# Patient Record
Sex: Male | Born: 1935 | Race: White | Hispanic: No | Marital: Married | Smoking: Never smoker
Health system: Southern US, Community
[De-identification: ages and names within clinical notes are randomized; demographics above are authoritative.]

## PROBLEM LIST (undated history)

## (undated) DIAGNOSIS — I519 Heart disease, unspecified: Secondary | ICD-10-CM

## (undated) DIAGNOSIS — R0602 Shortness of breath: Secondary | ICD-10-CM

## (undated) DIAGNOSIS — I10 Essential (primary) hypertension: Secondary | ICD-10-CM

## (undated) DIAGNOSIS — E7800 Pure hypercholesterolemia, unspecified: Secondary | ICD-10-CM

## (undated) DIAGNOSIS — K219 Gastro-esophageal reflux disease without esophagitis: Secondary | ICD-10-CM

## (undated) DIAGNOSIS — I2510 Atherosclerotic heart disease of native coronary artery without angina pectoris: Secondary | ICD-10-CM

## (undated) DIAGNOSIS — I219 Acute myocardial infarction, unspecified: Secondary | ICD-10-CM

## (undated) HISTORY — DX: Heart disease, unspecified: I51.9

## (undated) HISTORY — PX: CATARACT EXTRACTION: SUR2

## (undated) HISTORY — DX: Pure hypercholesterolemia, unspecified: E78.00

## (undated) HISTORY — DX: Shortness of breath: R06.02

## (undated) HISTORY — PX: APPENDECTOMY: SHX54

## (undated) HISTORY — DX: Essential (primary) hypertension: I10

## (undated) HISTORY — DX: Atherosclerotic heart disease of native coronary artery without angina pectoris: I25.10

## (undated) HISTORY — DX: Gastro-esophageal reflux disease without esophagitis: K21.9

## (undated) HISTORY — DX: Acute myocardial infarction, unspecified: I21.9

---

## 2004-09-30 ENCOUNTER — Emergency Department (HOSPITAL_COMMUNITY): Admission: EM | Admit: 2004-09-30 | Discharge: 2004-09-30 | Payer: Self-pay | Admitting: Family Medicine

## 2007-10-10 DIAGNOSIS — I219 Acute myocardial infarction, unspecified: Secondary | ICD-10-CM

## 2007-10-10 HISTORY — DX: Acute myocardial infarction, unspecified: I21.9

## 2008-02-13 ENCOUNTER — Ambulatory Visit: Payer: Self-pay | Admitting: Cardiology

## 2008-02-13 ENCOUNTER — Ambulatory Visit: Payer: Self-pay | Admitting: Pulmonary Disease

## 2008-02-13 ENCOUNTER — Inpatient Hospital Stay (HOSPITAL_COMMUNITY): Admission: EM | Admit: 2008-02-13 | Discharge: 2008-02-18 | Payer: Self-pay | Admitting: Emergency Medicine

## 2008-02-14 ENCOUNTER — Encounter: Payer: Self-pay | Admitting: Cardiology

## 2010-05-09 HISTORY — PX: CARDIAC CATHETERIZATION: SHX172

## 2010-05-15 ENCOUNTER — Ambulatory Visit: Payer: Self-pay | Admitting: Cardiology

## 2010-05-15 ENCOUNTER — Inpatient Hospital Stay (HOSPITAL_COMMUNITY): Admission: EM | Admit: 2010-05-15 | Discharge: 2010-05-17 | Payer: Self-pay | Admitting: Emergency Medicine

## 2010-06-09 ENCOUNTER — Ambulatory Visit: Payer: Self-pay | Admitting: Cardiology

## 2010-08-23 ENCOUNTER — Ambulatory Visit: Payer: Self-pay | Admitting: Cardiology

## 2010-12-07 ENCOUNTER — Ambulatory Visit: Payer: Self-pay | Admitting: Cardiology

## 2010-12-23 LAB — CBC
HCT: 41.8 % (ref 39.0–52.0)
HCT: 42.4 % (ref 39.0–52.0)
HCT: 42.5 % (ref 39.0–52.0)
Hemoglobin: 13.8 g/dL (ref 13.0–17.0)
Hemoglobin: 14 g/dL (ref 13.0–17.0)
Hemoglobin: 14.1 g/dL (ref 13.0–17.0)
MCH: 31.1 pg (ref 26.0–34.0)
MCH: 31.4 pg (ref 26.0–34.0)
MCH: 32.1 pg (ref 26.0–34.0)
MCHC: 32.5 g/dL (ref 30.0–36.0)
MCHC: 33.3 g/dL (ref 30.0–36.0)
MCHC: 33.5 g/dL (ref 30.0–36.0)
MCV: 94.4 fL (ref 78.0–100.0)
MCV: 95.7 fL (ref 78.0–100.0)
MCV: 95.9 fL (ref 78.0–100.0)
Platelets: 140 10*3/uL — ABNORMAL LOW (ref 150–400)
Platelets: 142 10*3/uL — ABNORMAL LOW (ref 150–400)
Platelets: 145 10*3/uL — ABNORMAL LOW (ref 150–400)
RBC: 4.36 MIL/uL (ref 4.22–5.81)
RBC: 4.44 MIL/uL (ref 4.22–5.81)
RBC: 4.49 MIL/uL (ref 4.22–5.81)
RDW: 13 % (ref 11.5–15.5)
RDW: 13.2 % (ref 11.5–15.5)
RDW: 13.3 % (ref 11.5–15.5)
WBC: 8.2 10*3/uL (ref 4.0–10.5)
WBC: 8.6 10*3/uL (ref 4.0–10.5)
WBC: 9.6 10*3/uL (ref 4.0–10.5)

## 2010-12-23 LAB — CK TOTAL AND CKMB (NOT AT ARMC)
CK, MB: 5.9 ng/mL — ABNORMAL HIGH (ref 0.3–4.0)
CK, MB: 7 ng/mL (ref 0.3–4.0)
CK, MB: 7.9 ng/mL (ref 0.3–4.0)
CK, MB: 8.5 ng/mL (ref 0.3–4.0)
Relative Index: 3 — ABNORMAL HIGH (ref 0.0–2.5)
Relative Index: 3.7 — ABNORMAL HIGH (ref 0.0–2.5)
Relative Index: 3.7 — ABNORMAL HIGH (ref 0.0–2.5)
Relative Index: 3.9 — ABNORMAL HIGH (ref 0.0–2.5)
Total CK: 189 U/L (ref 7–232)
Total CK: 198 U/L (ref 7–232)
Total CK: 201 U/L (ref 7–232)
Total CK: 227 U/L (ref 7–232)

## 2010-12-23 LAB — LIPID PANEL
Cholesterol: 118 mg/dL (ref 0–200)
HDL: 33 mg/dL — ABNORMAL LOW (ref >39)
LDL Cholesterol: 62 mg/dL (ref 0–99)
Total CHOL/HDL Ratio: 3.6 RATIO
Triglycerides: 113 mg/dL (ref <150)
VLDL: 23 mg/dL (ref 0–40)

## 2010-12-23 LAB — BASIC METABOLIC PANEL
BUN: 11 mg/dL (ref 6–23)
BUN: 22 mg/dL (ref 6–23)
CO2: 24 mEq/L (ref 19–32)
CO2: 26 mEq/L (ref 19–32)
Calcium: 8.5 mg/dL (ref 8.4–10.5)
Calcium: 8.6 mg/dL (ref 8.4–10.5)
Chloride: 105 mEq/L (ref 96–112)
Chloride: 107 mEq/L (ref 96–112)
Creatinine, Ser: 1.11 mg/dL (ref 0.4–1.5)
Creatinine, Ser: 1.19 mg/dL (ref 0.4–1.5)
GFR calc Af Amer: >60 mL/min (ref >60)
GFR calc Af Amer: >60 mL/min (ref >60)
GFR calc non Af Amer: 58 mL/min — ABNORMAL LOW (ref >60)
GFR calc non Af Amer: >60 mL/min (ref >60)
Glucose, Bld: 90 mg/dL (ref 70–99)
Glucose, Bld: 95 mg/dL (ref 70–99)
Potassium: 3.7 mEq/L (ref 3.5–5.1)
Potassium: 4 mEq/L (ref 3.5–5.1)
Sodium: 140 mEq/L (ref 135–145)
Sodium: 142 mEq/L (ref 135–145)

## 2010-12-23 LAB — DIFFERENTIAL
Basophils Absolute: 0 10*3/uL (ref 0.0–0.1)
Basophils Relative: 1 % (ref 0–1)
Eosinophils Absolute: 0.4 10*3/uL (ref 0.0–0.7)
Eosinophils Relative: 5 % (ref 0–5)
Lymphocytes Relative: 23 % (ref 12–46)
Lymphs Abs: 1.9 10*3/uL (ref 0.7–4.0)
Monocytes Absolute: 1 10*3/uL (ref 0.1–1.0)
Monocytes Relative: 13 % — ABNORMAL HIGH (ref 3–12)
Neutro Abs: 4.8 10*3/uL (ref 1.7–7.7)
Neutrophils Relative %: 59 % (ref 43–77)

## 2010-12-23 LAB — TROPONIN I
Troponin I: 0.01 ng/mL (ref 0.00–0.06)
Troponin I: 0.01 ng/mL (ref 0.00–0.06)
Troponin I: 0.01 ng/mL (ref 0.00–0.06)
Troponin I: <0.01 ng/mL (ref 0.00–0.06)

## 2010-12-23 LAB — PROTIME-INR
INR: 0.96 (ref 0.00–1.49)
Prothrombin Time: 13 seconds (ref 11.6–15.2)

## 2010-12-23 LAB — TSH: TSH: 4.284 u[IU]/mL (ref 0.350–4.500)

## 2010-12-23 LAB — BRAIN NATRIURETIC PEPTIDE: Pro B Natriuretic peptide (BNP): 261 pg/mL — ABNORMAL HIGH (ref 0.0–100.0)

## 2011-01-10 ENCOUNTER — Ambulatory Visit: Payer: Self-pay | Admitting: Cardiology

## 2011-01-11 ENCOUNTER — Encounter: Payer: Self-pay | Admitting: Cardiology

## 2011-01-11 DIAGNOSIS — I519 Heart disease, unspecified: Secondary | ICD-10-CM | POA: Insufficient documentation

## 2011-01-11 DIAGNOSIS — I10 Essential (primary) hypertension: Secondary | ICD-10-CM | POA: Insufficient documentation

## 2011-01-11 DIAGNOSIS — I219 Acute myocardial infarction, unspecified: Secondary | ICD-10-CM | POA: Insufficient documentation

## 2011-01-11 DIAGNOSIS — I2510 Atherosclerotic heart disease of native coronary artery without angina pectoris: Secondary | ICD-10-CM | POA: Insufficient documentation

## 2011-01-11 DIAGNOSIS — K219 Gastro-esophageal reflux disease without esophagitis: Secondary | ICD-10-CM | POA: Insufficient documentation

## 2011-01-11 DIAGNOSIS — R0602 Shortness of breath: Secondary | ICD-10-CM | POA: Insufficient documentation

## 2011-01-11 DIAGNOSIS — R079 Chest pain, unspecified: Secondary | ICD-10-CM | POA: Insufficient documentation

## 2011-01-11 DIAGNOSIS — E7800 Pure hypercholesterolemia, unspecified: Secondary | ICD-10-CM | POA: Insufficient documentation

## 2011-01-12 ENCOUNTER — Ambulatory Visit (INDEPENDENT_AMBULATORY_CARE_PROVIDER_SITE_OTHER): Payer: Medicare Other | Admitting: Cardiology

## 2011-01-12 ENCOUNTER — Encounter: Payer: Self-pay | Admitting: Cardiology

## 2011-01-12 VITALS — BP 122/60 | HR 58 | Ht 66.0 in | Wt 157.4 lb

## 2011-01-12 DIAGNOSIS — I519 Heart disease, unspecified: Secondary | ICD-10-CM

## 2011-01-12 DIAGNOSIS — I2510 Atherosclerotic heart disease of native coronary artery without angina pectoris: Secondary | ICD-10-CM

## 2011-01-12 DIAGNOSIS — K219 Gastro-esophageal reflux disease without esophagitis: Secondary | ICD-10-CM

## 2011-01-12 DIAGNOSIS — J302 Other seasonal allergic rhinitis: Secondary | ICD-10-CM | POA: Insufficient documentation

## 2011-01-12 DIAGNOSIS — I10 Essential (primary) hypertension: Secondary | ICD-10-CM

## 2011-01-12 DIAGNOSIS — E7800 Pure hypercholesterolemia, unspecified: Secondary | ICD-10-CM

## 2011-01-12 DIAGNOSIS — J309 Allergic rhinitis, unspecified: Secondary | ICD-10-CM

## 2011-01-12 MED ORDER — PANTOPRAZOLE SODIUM 40 MG PO TBEC: 40.0000 mg | DELAYED_RELEASE_TABLET | Freq: Every day | ORAL | Status: DC

## 2011-01-12 NOTE — Assessment & Plan Note (Signed)
Recommend OTC antihistamines. Avoid decongestants due to HTN.

## 2011-01-12 NOTE — Patient Instructions (Signed)
Continue current medications.  Take Protonix 40 mg daily for epigastric pain.  Avoid salt.  Lab work in January looked very good.  Take an antihistamine for sinus congestion such as Benadryl, Claritin, or Allegra. Avoid decongestants since this will raise your blood pressure.   We will see you for an office in 6 months.

## 2011-01-12 NOTE — Assessment & Plan Note (Signed)
Continue dual antiplatelet therapy. Continue risk factor modification.

## 2011-01-12 NOTE — Progress Notes (Signed)
HPI Leonard Simpson is seen for follow up today of CAD. He has been under a lot of stress recently with his wife being hospitalized with a stroke. He complains of a 1 week history of epigastric pain. This is constant. He has no abdominal pain. It is different than his cardiac pain. It was unrelieved with Tums but he did get good relief last night with his wife's Protonix. He denies any dyspnea, chest pain, edema, or orthopnea. His is not on Lasix now and has lost 6 lbs. No Known Allergies  Current Outpatient Prescriptions on File Prior to Visit  Medication Sig Dispense Refill  . aspirin 325 MG tablet Take 325 mg by mouth daily.        . clopidogrel (PLAVIX) 75 MG tablet Take 75 mg by mouth daily.        Marland Kitchen lisinopril (PRINIVIL,ZESTRIL) 10 MG tablet Take 10 mg by mouth daily.        . metoprolol (TOPROL-XL) 100 MG 24 hr tablet Take 100 mg by mouth daily.        . simvastatin (ZOCOR) 80 MG tablet Take 80 mg by mouth at bedtime.        . furosemide (LASIX) 20 MG tablet Take 20 mg by mouth as needed.        . nitroGLYCERIN (NITROSTAT) 0.4 MG SL tablet Place 0.4 mg under the tongue every 5 (five) minutes as needed.          Past Medical History  Diagnosis Date  . Chest pain     SHARP, KNIFE-LIKE PAIN   . SOB (shortness of breath)   . Coronary artery disease   . MI (myocardial infarction) 2009  . Hypertension   . Hypercholesterolemia   . GERD (gastroesophageal reflux disease)   . Left ventricular dysfunction     Past Surgical History  Procedure Date  . Cardiac catheterization 05/2010    NONOBSTRUCTIVE DISEASE    History reviewed. No pertinent family history.  History   Social History  . Marital Status: Married    Spouse Name: N/A    Number of Children: 2  . Years of Education: N/A   Occupational History  . brick laborer     retired   Social History Main Topics  . Smoking status: Never Smoker   . Smokeless tobacco: Former Neurosurgeon    Quit date: 01/11/1981  . Alcohol Use: No  .  Drug Use: No  . Sexually Active: Not on file   Other Topics Concern  . Not on file   Social History Narrative  . No narrative on file    ROS He complains of sinus congestion and drainage for the last month with a hoarse voice. No cough or hemoptysis. All other systems are reviewed and are negative. PHYSICAL EXAM BP 122/60  Pulse 58  Ht 5\' 6"  (1.676 m)  Wt 157 lb 6.4 oz (71.396 kg)  BMI 25.40 kg/m2 The patient is alert and oriented x 3.    The skin is warm and dry.  Color is normal.  The HEENT exam reveals that the sclera are nonicteric.  The mucous membranes are moist.  Sinus membranes are boggy with clear discharge. The carotids are 2+ without bruits.  There is no thyromegaly.  There is no JVD.  The lungs are clear.  The chest wall is non tender.  The heart exam reveals a regular rate with a normal S1 and S2.  There are no murmurs, gallops, or rubs.  The PMI  is not displaced.   Abdominal exam reveals good bowel sounds.  There is no guarding or rebound.  There is no hepatosplenomegaly or tenderness.  There are no masses.  Exam of the legs reveal no clubbing, cyanosis, or edema.  The legs are without rashes.  The distal pulses are intact.  Cranial nerves II - XII are intact.  Motor and sensory functions are intact.  The gait is normal.  ASSESSMENT AND PLAN

## 2011-01-12 NOTE — Assessment & Plan Note (Signed)
Labs by Dr. Ricki Miller looked excellent in Jan.  Continue simvastatin.

## 2011-01-12 NOTE — Assessment & Plan Note (Signed)
Well controlled on current Rx 

## 2011-01-12 NOTE — Assessment & Plan Note (Signed)
Continue ACE inhibitor. No evidence of volume overload. No on beta blocker due to bradycardia.

## 2011-01-12 NOTE — Assessment & Plan Note (Signed)
Epigastric pain consistent with GERD. Will add Protonix 40 mg daily.

## 2011-01-13 ENCOUNTER — Telehealth: Payer: Self-pay | Admitting: Cardiology

## 2011-01-16 ENCOUNTER — Emergency Department (HOSPITAL_COMMUNITY)
Admission: EM | Admit: 2011-01-16 | Discharge: 2011-01-16 | Disposition: A | Discharge status: Home / Self Care | Payer: Medicare Other | Attending: Emergency Medicine | Admitting: Emergency Medicine

## 2011-01-16 ENCOUNTER — Telehealth: Payer: Self-pay | Admitting: Cardiology

## 2011-01-16 ENCOUNTER — Other Ambulatory Visit: Payer: Self-pay | Admitting: *Deleted

## 2011-01-16 ENCOUNTER — Other Ambulatory Visit (HOSPITAL_COMMUNITY): Payer: Medicare Other | Admitting: Radiology

## 2011-01-16 ENCOUNTER — Emergency Department (HOSPITAL_COMMUNITY): Payer: Medicare Other

## 2011-01-16 DIAGNOSIS — I252 Old myocardial infarction: Secondary | ICD-10-CM | POA: Insufficient documentation

## 2011-01-16 DIAGNOSIS — I10 Essential (primary) hypertension: Secondary | ICD-10-CM

## 2011-01-16 DIAGNOSIS — I2510 Atherosclerotic heart disease of native coronary artery without angina pectoris: Secondary | ICD-10-CM | POA: Insufficient documentation

## 2011-01-16 DIAGNOSIS — Z79899 Other long term (current) drug therapy: Secondary | ICD-10-CM | POA: Insufficient documentation

## 2011-01-16 DIAGNOSIS — H5440 Blindness, one eye, unspecified eye: Secondary | ICD-10-CM

## 2011-01-16 DIAGNOSIS — H5460 Unqualified visual loss, one eye, unspecified: Secondary | ICD-10-CM | POA: Insufficient documentation

## 2011-01-16 DIAGNOSIS — Z7982 Long term (current) use of aspirin: Secondary | ICD-10-CM | POA: Insufficient documentation

## 2011-01-16 LAB — POCT I-STAT, CHEM 8
Chloride: 106 mEq/L (ref 96–112)
Creatinine, Ser: 1.2 mg/dL (ref 0.4–1.5)
Glucose, Bld: 96 mg/dL (ref 70–99)
Hemoglobin: 15.3 g/dL (ref 13.0–17.0)
Potassium: 4.2 mEq/L (ref 3.5–5.1)
Sodium: 142 mEq/L (ref 135–145)

## 2011-01-16 LAB — POCT CARDIAC MARKERS
CKMB, poc: 1.7 ng/mL (ref 1.0–8.0)
Myoglobin, poc: 66.5 ng/mL (ref 12–200)
Troponin i, poc: <0.05 ng/mL (ref 0.00–0.09)

## 2011-01-16 LAB — CBC
HCT: 42.6 % (ref 39.0–52.0)
Hemoglobin: 14.6 g/dL (ref 13.0–17.0)
RBC: 4.49 MIL/uL (ref 4.22–5.81)
WBC: 7.7 10*3/uL (ref 4.0–10.5)

## 2011-01-16 LAB — DIFFERENTIAL
Basophils Absolute: 0.1 10*3/uL (ref 0.0–0.1)
Basophils Relative: 1 % (ref 0–1)
Lymphocytes Relative: 26 % (ref 12–46)
Neutro Abs: 4.4 10*3/uL (ref 1.7–7.7)
Neutrophils Relative %: 57 % (ref 43–77)

## 2011-01-16 MED ORDER — CLOPIDOGREL BISULFATE 75 MG PO TABS: 75.0000 mg | ORAL_TABLET | Freq: Every day | ORAL | Status: DC

## 2011-01-16 MED ORDER — METOPROLOL SUCCINATE ER 100 MG PO TB24: 100.0000 mg | ORAL_TABLET | Freq: Every day | ORAL | Status: DC

## 2011-01-16 MED ORDER — LISINOPRIL 10 MG PO TABS: 10.0000 mg | ORAL_TABLET | Freq: Every day | ORAL | Status: DC

## 2011-01-16 NOTE — Telephone Encounter (Signed)
Son called stating father lost vision in (R) eye on Saturday. Saw eye dr (Dr. Dione Booze); told had plaque and needed further tests. Scheduled doppler and echo per request of Dr. Laruth Bouchard office; unable to get both tests done today so will send him to ER to get MRI-MRA brain;echo.Notified Trish.

## 2011-01-16 NOTE — Telephone Encounter (Signed)
Leonard Simpson (son)- Father lost sight in the right eye. Dr. Janene Harvey spoke with on-call doctor on Friday. Wanted several scans done to see how cholesterol is affecting loss of sight.

## 2011-01-16 NOTE — Telephone Encounter (Signed)
escribe medication per fax request  

## 2011-01-17 ENCOUNTER — Telehealth: Payer: Self-pay | Admitting: Cardiology

## 2011-01-17 ENCOUNTER — Other Ambulatory Visit: Payer: Self-pay | Admitting: Cardiology

## 2011-01-17 DIAGNOSIS — H5440 Blindness, one eye, unspecified eye: Secondary | ICD-10-CM

## 2011-01-17 NOTE — Telephone Encounter (Signed)
Patient's son has some concerns regarding his father's recent blindness in one of his eyes.  It was suggested by his ophthalmologist that he may need a carotid doppler done. Leonard Simpson. Wants to speak w/RN regarding whether he should be seen here so Dr.Jordan can order the tests.

## 2011-01-18 ENCOUNTER — Ambulatory Visit (HOSPITAL_COMMUNITY): Payer: Medicare Other | Attending: Cardiology | Admitting: Radiology

## 2011-01-18 ENCOUNTER — Telehealth: Payer: Self-pay | Admitting: *Deleted

## 2011-01-18 ENCOUNTER — Ambulatory Visit (HOSPITAL_COMMUNITY)
Admission: RE | Admit: 2011-01-18 | Discharge: 2011-01-18 | Disposition: A | Discharge status: Home / Self Care | Payer: Medicare Other | Source: Ambulatory Visit | Attending: Cardiology | Admitting: Cardiology

## 2011-01-18 DIAGNOSIS — H5310 Unspecified subjective visual disturbances: Secondary | ICD-10-CM

## 2011-01-18 DIAGNOSIS — I079 Rheumatic tricuspid valve disease, unspecified: Secondary | ICD-10-CM | POA: Insufficient documentation

## 2011-01-18 DIAGNOSIS — G459 Transient cerebral ischemic attack, unspecified: Secondary | ICD-10-CM

## 2011-01-18 DIAGNOSIS — I379 Nonrheumatic pulmonary valve disorder, unspecified: Secondary | ICD-10-CM | POA: Insufficient documentation

## 2011-01-18 DIAGNOSIS — I6529 Occlusion and stenosis of unspecified carotid artery: Secondary | ICD-10-CM | POA: Insufficient documentation

## 2011-01-18 DIAGNOSIS — H539 Unspecified visual disturbance: Secondary | ICD-10-CM | POA: Insufficient documentation

## 2011-01-18 DIAGNOSIS — I252 Old myocardial infarction: Secondary | ICD-10-CM | POA: Insufficient documentation

## 2011-01-18 DIAGNOSIS — I10 Essential (primary) hypertension: Secondary | ICD-10-CM | POA: Insufficient documentation

## 2011-01-18 DIAGNOSIS — I080 Rheumatic disorders of both mitral and aortic valves: Secondary | ICD-10-CM | POA: Insufficient documentation

## 2011-01-18 NOTE — Telephone Encounter (Signed)
Scheduled Echo for Leonard Simpson for blindness (r) eye;dopplers 4/11;echo today 4/11

## 2011-02-01 ENCOUNTER — Telehealth: Payer: Self-pay | Admitting: Cardiology

## 2011-02-01 NOTE — Telephone Encounter (Signed)
Wanted to know when he was due to see Dr. Swaziland again. Advised he was on recall for Oct but if needed to be seen sooner to let me know.  Said eye Dr. Was following him closely. Will call back if needs an app.

## 2011-02-01 NOTE — Telephone Encounter (Signed)
Son, Leonard Simpson, Razon, wants to know when his father is supposed to come in for an appointment. Pls.call and let him know.

## 2011-02-13 ENCOUNTER — Other Ambulatory Visit: Payer: Self-pay | Admitting: Cardiology

## 2011-02-13 NOTE — Telephone Encounter (Signed)
escribe medication per fax request  

## 2011-02-21 NOTE — H&P (Signed)
Leonard Simpson, Leonard Simpson NO.:  [id number]   MEDICAL RECORD NO.:  [id number]          PATIENT TYPE:  INP   LOCATION:  2908                         FACILITY:  [facility]   PHYSICIAN:  Vernice Jefferson, MD          DATE OF BIRTH:  01-26-1924   DATE OF ADMISSION:  02/13/2008  DATE OF DISCHARGE:                              HISTORY & PHYSICAL   CHIEF COMPLAINT:  Chest pressure.   HISTORY OF PRESENT ILLNESS:  The patient is an 77 year old white male  with only past medical history of gastroesophageal reflux disease who  was feeling acute onset chest pressure earlier on today at home.  EMS  was called and found to be bradycardic with rating of 30s as well as  having substernal chest pressure that started approximately 20-30  minutes prior to the EMS arrival, per their report.  Blood pressures  were noted to be 80s over 60s.  He subsequently transcutaneously paced  for bradycardia and given fentanyl and Versed for pain associated with  bradycardia.  When he arrived in the ED, his heart rate was in the 60s,  but he was altered, he was subsequently intubated in the ED for airway  protection and responded well atropine with increase in his heart rate  and was transferred subsequently to PCI lab for emergent PCI.  His EKG  demonstrated inferolateral ST-elevation changes.   PAST MEDICAL HISTORY:  1. GERD.  2. No prior cardiac history.   SOCIAL HISTORY:  He lives with his wife in [place] area.  No smoking.  No alcohol. No drug use.   FAMILY HISTORY:  Reviewed, noncontributory to the patient current  medical condition.   ALLERGIES:  No known drug allergies.   CURRENT MEDICATIONS:  Over-the-counter Prilosec.   REVIEW OF SYSTEMS:  Negative for review of system except for those  dictated in the above HPI.   PHYSICAL EXAMINATION:  VITAL SIGNS:  Blood pressure 150/73, heart rate  in the 80s.  He is afebrile.  GENERAL:  Well-developed, well-nourished white male in no acute  distress.  HEENT:  Moist mucous membranes.  No conjunctival pallor.  NECK:  Supple.  Full range of motion.  No jugular venous distention.  HEART:  Regular rate and rhythm.  No murmurs, rubs, or gallops.  CHEST:  Clear auscultations bilaterally.  No wheezes, rales, rhonchi.  ABDOMEN:  Soft.  Nontender, nondistended.  Normoactive bowel sounds.  EXTREMITIES:  No peripheral edema.  Pulse are 2+ bilaterally.  NEURO:  He is intubated and sedated.  Therefore, he can not be assessed.   Radiologic studies pending.  His EKG initially demonstrated normal sinus  rhythm with a high-grade second-degree AV block with ventricular rate in  30s with inferolateral ST-elevations with evidence of posterior  involvement.  Second EKG demonstrated atrial fibrillation with a  ventricular rate approximately 80 with the same inferolateral ST  elevations with evidence of posterior involvement.   LABS:  White count of 13.1, hemoglobin 15.2, and platelet 224.  BUN and  creatinine of 16 and 1.5, and glucose of 173.  Cardiac enzymes negative  on initial  set.   IMPRESSION:  1. Inferior ST-segment elevation myocardial infarction.   PLAN:  He is status post percutaneous coronary intervention to proximal  to mid right coronary artery lesion with Taxus drug-eluting stents.  He  did have ventricular fibrillation with defibrillated approximately 20  times while on the table, now since stabilized his rhythm, aspirin,  Plavix, Integrilin drip right now, statin and beta-blocker on acute 12  hour dosing based on blood pressure control.  Again in the morning, an  electrocardiogram to reassess LV function.  Left ventriculogram on  initial postcatheterization appeared to be just mildly decreased with  basal to mid inferior wall motion abnormality.  Then management by the  critical care team, will keep him sedated overnight and follow him  clinically.  The patient did possibly aspirate during the intubation;  therefore, we will  follow up on his chest x-ray and may consider  starting antibiotics based on chest x-ray.      Vernice Jefferson, MD  Electronically Signed     JT/MEDQ  D:  02/14/2008  T:  02/14/2008  Job:  [id number]

## 2011-02-21 NOTE — Cardiovascular Report (Signed)
NAME:  Leonard Simpson, FAROOQUI NO.:  [id number]   MEDICAL RECORD NO.:  [id number]           PATIENT TYPE:   LOCATION:                                 FACILITY:   PHYSICIAN:  Peter M. Swaziland, M.D.  DATE OF BIRTH:  1924/08/27   DATE OF PROCEDURE:  DATE OF DISCHARGE:                            CARDIAC CATHETERIZATION   INDICATIONS FOR PROCEDURE:  An 77 year old white male previously in good  health who presented with acute syncopal episode and chest pain.  ECG  performed by EMS showed an acute inferior myocardial infarction with ST  elevation in leads II, III, aVF.  The patient was in complete heart  block.  He had been given fentanyl and Versed in the field and had a  respiratory arrest in the emergency department.  He was intubated.  He  had his old pacemaker in place and brought emergently to the cardiac  catheterization laboratory.   PROCEDURES:  Left heart catheterization, coronary and left ventricular  angiography, and intracoronary stenting of the right coronary.   NEW MEDICATIONS:  The patient was on IV fentanyl drip at 50 mcg per hour  and IV Versed drip at 2 mg per hour.  He received amiodarone 150 mg  bolus and lidocaine 100 mg bolus.  He received Plavix 600 mg per NG  tube.  He received heparin with subsequent ACT of 257 seconds.  He  received double bolus of Integrilin followed by continuous IV infusion.   CONTRAST:  Omnipaque 200 mL.   HEMODYNAMIC DATA:  Aortic pressure of the end of procedure was 177/103  with a mean of 137 and left ventricle pressure was 178 with EDP of 24  mmHg.   COMPLICATIONS:  Initially after being prepped and draped, we confirmed  that the patient's endotracheal tube was in position under fluoro.  We  also confirm his NG tube was in position in the stomach.  Intra-arterial  access was obtained using standard Seldinger technique.  However, before  any angiogram could be performed, the patient developed again transient  heart block  with heart rates in the 30s.  We placed a temporary  transvenous pacing wire through the right femoral vein into the right  heart with good capture.  The patient developed multiple episodes of  ventricular fibrillation and ventricular flutter requiring emergent  cardioversion.  He had up to 15 episodes of this.  After reperfusion of  his right coronary, his rhythm stabilized and he went into sinus rhythm  with a stable rate and required no further pacing or defibrillation.  His blood pressure also stabilized at this point.   ANGIOGRAPHIC DATA:  The left coronary arises and distributes normally.  The left main coronary appeared normal.  The left anterior descending  artery was moderately calcified.  Had diffuse 50% narrowing in the  proximal vessel extending to the mid vessel past the diagonal branch.  The diagonal branch has 70%-80% disease proximally.   It was a moderate-sized intermediate branch which had a 70%-80% stenosis  at its ostium.   The left circumflex coronary was severely calcified and had 30%  narrowing in  the proximal vessel and in the first marginal branch.   The right coronary was a dominant vessel and was occluded proximally.   At this point, we proceeded with emergent intervention of the right  coronary artery.  Using a 6-French FR-4 guide and a 0.014 Prowater wire,  we were able to cross the lesion well.  We initially predilated the  lesion using a 3.0- x 15-mm apex balloon up to 6 atm.  This yielded  reperfusion.  There was extensive disease in the proximal to mid right  coronary with dissection extending proximally and into the midvessel,  and there was thrombus located in the midvessel.  We subsequently  stented the entire proximal to mid right coronary artery using  overlapping 3.5- x 28-mm TAXUS Liberte stents beginning distally and  moving proximally.  These stents were each deployed at 8 atm and then 12  atm with a stent balloon.  We then postdilated the  entire stented  segment with a 3.75- x 20-mm Quantum Maverick balloon up to 14 atm x4.  This yielded an excellent angiographic result with 0% residual stenosis  and TIMI grade 3 flow.  There was no further evidence of dissection or  thrombus.  There was good filling distally.  As noted, the patient's  heart rhythm and blood pressure stabilized at this point.  His ST  segments appeared to return to baseline, and he was transferred to the  Coronary Intensive Care Unit for further aggressive support.   FINAL INTERPRETATION:  1. Two-vessel obstructive atherosclerotic coronary artery disease with      acute occlusion of the proximal right coronary artery.  2. Mild left ventricular dysfunction.  Left ventricular angiography      performed at the end of the case showed basal-to-mid inferior wall      hypokinesia moderate degree with overall mild left ventricular      dysfunction ejection fraction estimated at 45%-50%.  3. Successful reperfusion with intracoronary stenting of the proximal      to mid right coronary artery with drug-eluting stents.           ______________________________  Peter M. Swaziland, M.D.     PMJ/MEDQ  D:  02/14/2008  T:  02/14/2008  Job:  [id number]

## 2011-02-21 NOTE — Discharge Summary (Signed)
NAME:  Leonard Simpson, SCHMIEDER NO.:  [id number]   MEDICAL RECORD NO.:  [id number]          PATIENT TYPE:  INP   LOCATION:  3733                         FACILITY:  [facility]   PHYSICIAN:  Peter M. Swaziland, M.D.  DATE OF BIRTH:  [birth year]   DATE OF ADMISSION:  02/13/2008  DATE OF DISCHARGE:  02/18/2008                               DISCHARGE SUMMARY   HISTORY OF PRESENT ILLNESS:  Mr. Vreeland is an 77 year old white male  who has a history of acid reflux disease.  He complained of acute onset  of chest pain on the day of admission.  An EMS was called and the  patient was found to be severely hypotensive and bradycardic.  He had  Zoll pads placed.  ECG demonstrated acute ST elevation myocardial  infarction inferiorly with complete heart block.  The patient was given  fentanyl sedation in the field and subsequently had increased altered  mental status.  On arrival to the emergency room department, he was  intubated for airway protection and had some witnessed aspiration with  that.  The patient continued to be hemodynamically unstable.  The  patient has no known prior cardiac history.  He had no known allergies.  For details of his past medical history, social history, family history,  and physical exam, please see admission history and physical.   LABORATORY DATA:  Initial ECG showed marked ST elevation in leads II,  III, and AVF as well as milder degree of ST elevation in leads V4  through V5 with reciprocal ST depression in leads I and V2.  The patient  had underlying atrial fibrillation with markedly slow ventricular  response.   HOSPITAL COURSE:  The patient was taken emergently to the cardiac  catheterization laboratory after he was intubated in the emergency room.  He had an NG tube placed.  He was sedated with IV Versed and fentanyl  and respiratory therapy helped manage the patient's ventilator as he was  transported to the cardiac catheterization laboratory.  There,  the  patient underwent left heart catheterization, coronary left angiography.  The patient was very electrically unstable.  We initially placed  temporary transvenous pacemaker via the right femoral vein.  He was  successfully paced.  He then began to have multiple episodes of  ventricular fibrillation and ventricular flutter requiring multiple  shocks.  Altogether, the patient had 22 shocks during the cath lab  procedure.  Coronary angiography during this time demonstrated diffuse  50% disease in the proximal to mid LAD.  There was 70%-80% disease in  the first diagonal branch.  There was also an intermediate branch, which  had 70%-80% ostial stenosis.  Left circumflex coronary had  nonobstructive disease and the right coronary was occluded proximally.  Left ventricular angiography performed in the end of the study  demonstrated basal to mid inferior hypokinesia with overall mild left  ventricular dysfunction, ejection fraction of 45%-50%.  We proceeded  with emergent intervention of the right coronary artery.  The patient  was treated with IV Integrilin and IV heparin, subsequent ACT of 257  seconds.  The right coronary artery was  reperfused with balloon  angioplasty.  This subsequently demonstrated extensive dissection from  the proximal to the mid right coronary artery with thrombus.  This  entire segment was stented using two 3.5 x 28-mm Taxus Liberte stents.  They were postdilated to 3.75 mm.  An excellent angiographic result was  obtained with 0% residual stenosis and TIMI grade 3 flow.  Once  reperfusion was established, the patient hemodynamically improved  significantly.  He had no further episodes of ventricular tachycardia or  fibrillation.  He did receive a single bolus of IV amiodarone and IV  lidocaine in the lab.  His complete heart block resolved.  The patient  was subsequently transferred to the coronary intensive care unit where  he was continued on ventilator support.   He was loaded with aspirin and  Plavix.  He was started on beta blocker therapy.  He was maintained on  IV Integrilin for 18 hours.  By the following morning the patient was  breathing on his own and was able to be successfully extubated.  He had  no subsequent chest pain during his hospital stay, had minimal cough and  sore throat due to his intubation, but otherwise did not develop any  signs or symptoms of aspiration pneumonia.  His chest x-ray remained  clear.   LABORATORY DATA:  His hemoglobin was 15.2, hematocrit 44.4, white count  was 13,100 and declined to 11,500 prior to discharge, platelet count was  224,000.  His coags were normal.  Sodium was 137, potassium 4.2,  chloride 101, CO2 of 23, glucose 172, BUN 16, and creatinine 1.52.  Subsequent creatinine prior to discharge was 1.3 and his glucose's is  normalized.  Initial CPK was 156 with 5.0 MB, then increased to 3416  with 326 MB and then 3887 with 219 MB.  Troponin started at 0.02 and  increased to 60.64 and then 95.23.  Lipid panel showed a total  cholesterol 224, triglycerides 331, HDL 23, and an LDL of 135.  Hemoglobin A1c was 5.8%.   Mr. Tarnowski continued to make excellent progress.  He had no  recurrent anginal symptoms or signs of heart failure.  He had no  subsequent arrhythmias.  He was placed on aspirin, Plavix, ACE  inhibitor, and beta blocker therapy.  He was placed on high-dose statin  therapy.  He was progressively ambulated and made an excellent progress  cardiac rehab therapy.  He was discharged home in stable condition on  Feb 18, 2008.   DISCHARGE DIAGNOSES:  1. Acute inferior ST elevation myocardial infarction.  2. Repetitive ventricular fibrillation.  3. Complete heart block.  4. Atrial fibrillation, resolved.  5. Combined dyslipidemia.  6. Hypertension.  7. Ventilator-dependent respiratory failure, resolved.   DISCHARGE MEDICATIONS:  1. Aspirin 325 mg per day.  2. Plavix 75 mg per day.  3.  Toprol-XL 100 mg per day.  4. Lisinopril 10 mg per day.  5. Simvastatin 80 mg per day.  6. Nitroglycerin 0.4 mg sublingual p.r.n.  7. Protonix 40 mg per day.   The patient was instructed not to drive or do any lifting for 2 weeks.  He is to increase his activity slowly and was given exercise  recommendations by cardiac rehab.  I have encouraged him to enroll in  outpatient cardiac rehab therapy.  He will follow up with Dr. Swaziland in  2 weeks.   DISCHARGE STATUS:  Improved.           ______________________________  Peter M. Swaziland, M.D.  PMJ/MEDQ  D:  02/18/2008  T:  02/18/2008  Job:  [id number]   cc:   Dr. Suzie Portela, [facility]

## 2011-04-27 NOTE — Telephone Encounter (Signed)
Pt was seen in er

## 2011-06-06 ENCOUNTER — Other Ambulatory Visit: Payer: Self-pay | Admitting: Cardiology

## 2011-06-07 NOTE — Telephone Encounter (Signed)
escribe medication per fax request  

## 2011-07-18 ENCOUNTER — Other Ambulatory Visit: Payer: Self-pay | Admitting: Cardiology

## 2011-08-21 ENCOUNTER — Other Ambulatory Visit: Payer: Self-pay | Admitting: Cardiology

## 2012-01-21 ENCOUNTER — Other Ambulatory Visit: Payer: Self-pay | Admitting: Cardiology

## 2012-01-23 NOTE — Telephone Encounter (Signed)
ENCOUNTER COMPLETED 

## 2012-01-24 ENCOUNTER — Encounter: Payer: Self-pay | Admitting: Cardiology

## 2012-01-24 ENCOUNTER — Ambulatory Visit (INDEPENDENT_AMBULATORY_CARE_PROVIDER_SITE_OTHER): Payer: Medicare Other | Admitting: Cardiology

## 2012-01-24 VITALS — BP 152/84 | HR 54 | Ht 66.0 in | Wt 162.0 lb

## 2012-01-24 DIAGNOSIS — I509 Heart failure, unspecified: Secondary | ICD-10-CM

## 2012-01-24 DIAGNOSIS — I2510 Atherosclerotic heart disease of native coronary artery without angina pectoris: Secondary | ICD-10-CM

## 2012-01-24 DIAGNOSIS — I519 Heart disease, unspecified: Secondary | ICD-10-CM

## 2012-01-24 DIAGNOSIS — I10 Essential (primary) hypertension: Secondary | ICD-10-CM

## 2012-01-24 MED ORDER — LISINOPRIL 20 MG PO TABS: 20.0000 mg | ORAL_TABLET | Freq: Every day | ORAL | Status: DC

## 2012-01-24 MED ORDER — METOPROLOL SUCCINATE ER 50 MG PO TB24: 50.0000 mg | ORAL_TABLET | Freq: Every day | ORAL | Status: AC

## 2012-01-24 NOTE — Progress Notes (Signed)
HPI Leonard Simpson is seen for follow up today of CAD. He reports that he has been doing fairly well. He reduced his dose of metoprolol by half and states that he feels so much better. He previously was having some pain in the left scapular area and this resolved. His breathing is much improved. He does complain that his nose runs a lot. He still has some staggering in his gait and decreased vision related to his stroke last year. He has no significant chest pain and states he has used his Protonix only on a when necessary basis. No Known Allergies  Current Outpatient Prescriptions on File Prior to Visit  Medication Sig Dispense Refill  . aspirin 325 MG tablet Take 325 mg by mouth daily.        . clopidogrel (PLAVIX) 75 MG tablet TAKE 1 TABLET BY MOUTH EVERY DAY  30 tablet  0  . nitroGLYCERIN (NITROSTAT) 0.4 MG SL tablet Place 0.4 mg under the tongue every 5 (five) minutes as needed.        . simvastatin (ZOCOR) 80 MG tablet TAKE 1 TABLET BY MOUTH EVERY DAY  90 tablet  3  . pantoprazole (PROTONIX) 40 MG tablet Take 1 tablet (40 mg total) by mouth daily.  30 tablet  11    Past Medical History  Diagnosis Date  . Chest pain     SHARP, KNIFE-LIKE PAIN   . SOB (shortness of breath)   . Coronary artery disease   . MI (myocardial infarction) 2009  . Hypertension   . Hypercholesterolemia   . GERD (gastroesophageal reflux disease)   . Left ventricular dysfunction     Past Surgical History  Procedure Date  . Cardiac catheterization 05/2010    NONOBSTRUCTIVE DISEASE    History reviewed. No pertinent family history.  History   Social History  . Marital Status: Married    Spouse Name: N/A    Number of Children: 2  . Years of Education: N/A   Occupational History  . brick laborer     retired   Social History Main Topics  . Smoking status: Never Smoker   . Smokeless tobacco: Former Neurosurgeon    Quit date: 01/11/1981  . Alcohol Use: No  . Drug Use: No  . Sexually Active: Not on file    Other Topics Concern  . Not on file   Social History Narrative  . No narrative on file    ROS He complains of sinus congestion and drainage. No cough or hemoptysis. No recurrent TIA or CVA symptoms. All other systems are reviewed and are negative. PHYSICAL EXAM BP 152/84  Pulse 54  Ht 5\' 6"  (1.676 m)  Wt 162 lb (73.483 kg)  BMI 26.15 kg/m2 The patient is alert and oriented x 3.    The skin is warm and dry.  Color is normal.  The HEENT exam reveals that the sclera are nonicteric.  The mucous membranes are moist.  Sinus membranes are boggy with clear discharge. The carotids are 2+ without bruits.  There is no thyromegaly.  There is no JVD.  The lungs are clear.  The chest wall is non tender.  The heart exam reveals a regular rate with a normal S1 and S2.  There is a grade 2/6 systolic murmur at the right upper sternal border.  The PMI is not displaced.   Abdominal exam reveals good bowel sounds.  There is no guarding or rebound.  There is no hepatosplenomegaly or tenderness.  There are  no masses.  Exam of the legs reveal no clubbing, cyanosis, or edema.  The legs are without rashes.  The distal pulses are intact.  Cranial nerves II - XII are intact.  Motor and sensory functions are intact.  The gait is normal.  Laboratory data: ECG today demonstrates sinus bradycardia with a rate of 54 beats per minute. He has voltage criteria for LVH.  ASSESSMENT AND PLAN

## 2012-01-24 NOTE — Assessment & Plan Note (Signed)
We will increase his lisinopril as noted above.

## 2012-01-24 NOTE — Assessment & Plan Note (Signed)
He is having no significant anginal symptoms. In fact he is feeling much better with reduction in his metoprolol dose. This makes sense given that his pulse rate today is slow even on the lower dose. We will continue with his dual antiplatelet therapy and current dose of metoprolol.

## 2012-01-24 NOTE — Assessment & Plan Note (Signed)
He appears to be well compensated on exam. His blood pressure is higher today. We will continue with the lower dose of metoprolol but I have increased his lisinopril to 20 mg per day. We will continue with sodium restriction.

## 2012-01-24 NOTE — Patient Instructions (Signed)
Stay on the lower dose of Toprol XL 50 mg daily  Increase lisinopril to 20 mg daily.  Continue your other therapy.  Lawson Fiscal will see you again in 3 months.

## 2012-02-18 ENCOUNTER — Other Ambulatory Visit: Payer: Self-pay | Admitting: Cardiology

## 2012-03-06 ENCOUNTER — Telehealth: Payer: Self-pay | Admitting: Cardiology

## 2012-03-06 MED ORDER — LISINOPRIL 20 MG PO TABS: 20.0000 mg | ORAL_TABLET | Freq: Every day | ORAL | Status: AC

## 2012-03-06 NOTE — Telephone Encounter (Signed)
Patient's son called stated patient needed prescription for lisinopril 20 mg daily sent to [address] [facility] [address].Lisinopril 20 mg daily sent to [facility].

## 2012-03-06 NOTE — Telephone Encounter (Signed)
Please return call to [employer].    Patient son [phone], regarding lisinopril Med change.

## 2012-03-18 ENCOUNTER — Encounter: Payer: Self-pay | Admitting: Cardiology

## 2012-04-12 ENCOUNTER — Other Ambulatory Visit: Payer: Self-pay | Admitting: Cardiology

## 2012-05-02 ENCOUNTER — Ambulatory Visit: Payer: Medicare Other | Admitting: Cardiology

## 2012-05-10 ENCOUNTER — Other Ambulatory Visit: Payer: Self-pay | Admitting: Cardiology

## 2012-06-06 ENCOUNTER — Ambulatory Visit: Payer: Medicare Other | Admitting: Cardiology

## 2012-07-12 ENCOUNTER — Ambulatory Visit: Payer: Medicare Other | Admitting: Cardiology

## 2012-07-29 ENCOUNTER — Ambulatory Visit (INDEPENDENT_AMBULATORY_CARE_PROVIDER_SITE_OTHER): Payer: Medicare Other | Admitting: Cardiology

## 2012-07-29 ENCOUNTER — Encounter: Payer: Self-pay | Admitting: Cardiology

## 2012-07-29 VITALS — BP 152/74 | HR 64 | Ht 66.0 in | Wt 146.0 lb

## 2012-07-29 DIAGNOSIS — I10 Essential (primary) hypertension: Secondary | ICD-10-CM

## 2012-07-29 DIAGNOSIS — I519 Heart disease, unspecified: Secondary | ICD-10-CM

## 2012-07-29 DIAGNOSIS — E7800 Pure hypercholesterolemia, unspecified: Secondary | ICD-10-CM

## 2012-07-29 DIAGNOSIS — I2510 Atherosclerotic heart disease of native coronary artery without angina pectoris: Secondary | ICD-10-CM

## 2012-07-29 NOTE — Progress Notes (Signed)
HPI Leonard Simpson is seen for follow up today of CAD. He complains that he stays drunk all the time and has difficulty walking a straight line. He has had problems with losing his eyesight. He has undergone previous cataract surgery and then had  laser surgery on his retina. He has chronic shortness of breath with exertion. This has not changed significantly. He denies any chest pain or palpitations. He's had no increase in edema. No Known Allergies  Current Outpatient Prescriptions on File Prior to Visit  Medication Sig Dispense Refill  . aspirin 325 MG tablet Take 325 mg by mouth daily.        . clopidogrel (PLAVIX) 75 MG tablet TAKE 1 TABLET BY MOUTH EVERY DAY  30 tablet  5  . lisinopril (PRINIVIL,ZESTRIL) 20 MG tablet Take 1 tablet (20 mg total) by mouth daily.  30 tablet  11  . metoprolol succinate (TOPROL XL) 50 MG 24 hr tablet Take 1 tablet (50 mg total) by mouth daily. Take with or immediately following a meal.  30 tablet  11  . nitroGLYCERIN (NITROSTAT) 0.4 MG SL tablet Place 0.4 mg under the tongue every 5 (five) minutes as needed.        . simvastatin (ZOCOR) 80 MG tablet TAKE 1 TABLET BY MOUTH EVERY DAY  90 tablet  3  . DISCONTD: pantoprazole (PROTONIX) 40 MG tablet Take 1 tablet (40 mg total) by mouth daily.  30 tablet  11    Past Medical History  Diagnosis Date  . Chest pain     SHARP, KNIFE-LIKE PAIN   . SOB (shortness of breath)   . Coronary artery disease   . MI (myocardial infarction) 2009  . Hypertension   . Hypercholesterolemia   . GERD (gastroesophageal reflux disease)   . Left ventricular dysfunction     Past Surgical History  Procedure Date  . Cardiac catheterization 05/2010    NONOBSTRUCTIVE DISEASE  . Cataract extraction     History reviewed. No pertinent family history.  History   Social History  . Marital Status: Married    Spouse Name: N/A    Number of Children: 2  . Years of Education: N/A   Occupational History  . brick laborer     retired     Social History Main Topics  . Smoking status: Never Smoker   . Smokeless tobacco: Former Neurosurgeon    Quit date: 01/11/1981  . Alcohol Use: No  . Drug Use: No  . Sexually Active: Not on file   Other Topics Concern  . Not on file   Social History Narrative  . No narrative on file    ROS He complains of sinus congestion and drainage. No cough or hemoptysis. No recurrent TIA or CVA symptoms. All other systems are reviewed and are negative.  PHYSICAL EXAM BP 152/74  Pulse 64  Ht 5\' 6"  (1.676 m)  Wt 66.225 kg (146 lb)  BMI 23.56 kg/m2 The patient is alert and oriented x 3.    The skin is warm and dry.  Color is normal.  The HEENT exam reveals that the sclera are nonicteric.  The mucous membranes are moist.   The carotids are 2+ without bruits.  There is no thyromegaly.  There is no JVD.  The lungs are clear.  The chest wall is non tender.  The heart exam reveals a regular rate with a normal S1 and S2.  There is a grade 2/6 systolic murmur at the right upper sternal border.  The PMI is not displaced.   Abdominal exam reveals good bowel sounds.  There is no guarding or rebound.  There is no hepatosplenomegaly or tenderness.  There are no masses.  Exam of the legs reveal no clubbing, cyanosis, or edema.  The legs are without rashes.  The distal pulses are intact.  Cranial nerves II - XII are intact.  Motor and sensory functions are intact.  The gait is normal.  Laboratory data:  Dated 03/18/2012 CBC and thyroid functions were normal. Chemistries were normal. BUN 15 and creatinine 1.18. Urinalysis was normal. Total cholesterol 117, triglycerides 136, LDL 60, HDL 30.  ASSESSMENT AND PLAN  1. Coronary disease. Patient is status post stenting of the right coronary during an acute inferior myocardial infarction in 2009. Subsequent cardiac catheterization in 2011 showed nonobstructive disease. Patient has no significant clinical symptoms of angina. He will continue with dual antiplatelet therapy and  statin therapy.  2. History of left ventricular dysfunction. His most recent echocardiogram in April 2012 showed normal ejection fraction.  3. Mild aortic stenosis.  4. Hypertension, well controlled.  5. Hyperlipidemia, well controlled.  6. Gait instability. I think his visual loss is contributing significantly to this. He is going to followup with ophthalmology.

## 2012-07-29 NOTE — Patient Instructions (Addendum)
Continue your current medications.  We will get a copy of your lab work from Dr. Ricki Miller.  I will see you again in 6 months.

## 2012-09-17 ENCOUNTER — Telehealth: Payer: Self-pay | Admitting: Cardiology

## 2012-09-17 NOTE — Telephone Encounter (Signed)
Pt needs refill of clopidigrel , uses [facility] [address], pt out requested last week

## 2012-09-18 MED ORDER — CLOPIDOGREL BISULFATE 75 MG PO TABS: 75.0000 mg | ORAL_TABLET | Freq: Every day | ORAL | Status: DC

## 2012-11-19 ENCOUNTER — Encounter: Payer: Self-pay | Admitting: Cardiology

## 2013-03-07 ENCOUNTER — Other Ambulatory Visit: Payer: Self-pay

## 2013-03-07 MED ORDER — CLOPIDOGREL BISULFATE 75 MG PO TABS: 75.0000 mg | ORAL_TABLET | Freq: Every day | ORAL | Status: DC

## 2013-03-25 ENCOUNTER — Other Ambulatory Visit: Payer: Self-pay | Admitting: *Deleted

## 2013-03-25 MED ORDER — LISINOPRIL 20 MG PO TABS: 20.0000 mg | ORAL_TABLET | Freq: Every day | ORAL | Status: DC

## 2013-06-19 ENCOUNTER — Other Ambulatory Visit: Payer: Self-pay

## 2013-06-19 MED ORDER — SIMVASTATIN 80 MG PO TABS: ORAL_TABLET | ORAL | Status: DC

## 2013-07-16 ENCOUNTER — Other Ambulatory Visit: Payer: Self-pay | Admitting: Cardiology

## 2013-08-17 ENCOUNTER — Other Ambulatory Visit: Payer: Self-pay | Admitting: Cardiology

## 2013-09-14 ENCOUNTER — Other Ambulatory Visit: Payer: Self-pay | Admitting: Cardiology

## 2013-12-19 ENCOUNTER — Other Ambulatory Visit: Payer: Self-pay | Admitting: Cardiology

## 2013-12-31 ENCOUNTER — Other Ambulatory Visit: Payer: Self-pay | Admitting: *Deleted

## 2013-12-31 ENCOUNTER — Telehealth: Payer: Self-pay | Admitting: *Deleted

## 2013-12-31 ENCOUNTER — Other Ambulatory Visit: Payer: Self-pay | Admitting: Cardiology

## 2013-12-31 MED ORDER — SIMVASTATIN 80 MG PO TABS: ORAL_TABLET | ORAL | Status: AC

## 2013-12-31 NOTE — Telephone Encounter (Signed)
Patient needs appointment. Can you call to schedule please? Thanks, MI

## 2013-12-31 NOTE — Telephone Encounter (Signed)
Follow Up  Made appt for pt on 01/21/2014 @ 9:45// SR

## 2014-01-21 ENCOUNTER — Ambulatory Visit: Payer: Medicare Other | Admitting: Cardiology

## 2014-01-29 ENCOUNTER — Encounter: Payer: Self-pay | Admitting: Cardiology

## 2018-07-04 DIAGNOSIS — I454 Nonspecific intraventricular block: Secondary | ICD-10-CM | POA: Diagnosis not present

## 2018-07-04 DIAGNOSIS — K5900 Constipation, unspecified: Secondary | ICD-10-CM | POA: Diagnosis not present

## 2018-07-04 DIAGNOSIS — I10 Essential (primary) hypertension: Secondary | ICD-10-CM | POA: Diagnosis not present

## 2018-07-04 DIAGNOSIS — K8020 Calculus of gallbladder without cholecystitis without obstruction: Secondary | ICD-10-CM | POA: Diagnosis not present

## 2018-07-04 DIAGNOSIS — K838 Other specified diseases of biliary tract: Secondary | ICD-10-CM | POA: Diagnosis not present

## 2018-07-04 DIAGNOSIS — R1010 Upper abdominal pain, unspecified: Secondary | ICD-10-CM | POA: Diagnosis not present

## 2018-07-04 DIAGNOSIS — Z87891 Personal history of nicotine dependence: Secondary | ICD-10-CM | POA: Diagnosis not present

## 2018-07-04 DIAGNOSIS — R1084 Generalized abdominal pain: Secondary | ICD-10-CM | POA: Diagnosis not present

## 2019-12-03 ENCOUNTER — Inpatient Hospital Stay (HOSPITAL_COMMUNITY)
Admission: EM | Admit: 2019-12-03 | Discharge: 2020-01-08 | DRG: 871 | Disposition: E | Payer: Medicare Other | Attending: Internal Medicine | Admitting: Internal Medicine

## 2019-12-03 ENCOUNTER — Other Ambulatory Visit: Payer: Self-pay

## 2019-12-03 ENCOUNTER — Emergency Department (HOSPITAL_COMMUNITY): Payer: Medicare Other

## 2019-12-03 ENCOUNTER — Encounter (HOSPITAL_COMMUNITY): Payer: Self-pay | Admitting: Emergency Medicine

## 2019-12-03 DIAGNOSIS — R6520 Severe sepsis without septic shock: Secondary | ICD-10-CM | POA: Diagnosis present

## 2019-12-03 DIAGNOSIS — R0682 Tachypnea, not elsewhere classified: Secondary | ICD-10-CM

## 2019-12-03 DIAGNOSIS — S31109A Unspecified open wound of abdominal wall, unspecified quadrant without penetration into peritoneal cavity, initial encounter: Secondary | ICD-10-CM | POA: Diagnosis present

## 2019-12-03 DIAGNOSIS — J810 Acute pulmonary edema: Secondary | ICD-10-CM | POA: Diagnosis not present

## 2019-12-03 DIAGNOSIS — K8020 Calculus of gallbladder without cholecystitis without obstruction: Secondary | ICD-10-CM | POA: Diagnosis not present

## 2019-12-03 DIAGNOSIS — N400 Enlarged prostate without lower urinary tract symptoms: Secondary | ICD-10-CM | POA: Diagnosis present

## 2019-12-03 DIAGNOSIS — E860 Dehydration: Secondary | ICD-10-CM | POA: Diagnosis present

## 2019-12-03 DIAGNOSIS — J189 Pneumonia, unspecified organism: Secondary | ICD-10-CM | POA: Diagnosis present

## 2019-12-03 DIAGNOSIS — A419 Sepsis, unspecified organism: Principal | ICD-10-CM | POA: Diagnosis present

## 2019-12-03 DIAGNOSIS — Z743 Need for continuous supervision: Secondary | ICD-10-CM | POA: Diagnosis not present

## 2019-12-03 DIAGNOSIS — Z7902 Long term (current) use of antithrombotics/antiplatelets: Secondary | ICD-10-CM | POA: Diagnosis not present

## 2019-12-03 DIAGNOSIS — I447 Left bundle-branch block, unspecified: Secondary | ICD-10-CM | POA: Diagnosis present

## 2019-12-03 DIAGNOSIS — K5900 Constipation, unspecified: Secondary | ICD-10-CM | POA: Diagnosis not present

## 2019-12-03 DIAGNOSIS — S0990XA Unspecified injury of head, initial encounter: Secondary | ICD-10-CM | POA: Diagnosis not present

## 2019-12-03 DIAGNOSIS — E7800 Pure hypercholesterolemia, unspecified: Secondary | ICD-10-CM | POA: Diagnosis present

## 2019-12-03 DIAGNOSIS — G253 Myoclonus: Secondary | ICD-10-CM | POA: Diagnosis not present

## 2019-12-03 DIAGNOSIS — W19XXXD Unspecified fall, subsequent encounter: Secondary | ICD-10-CM | POA: Diagnosis not present

## 2019-12-03 DIAGNOSIS — N401 Enlarged prostate with lower urinary tract symptoms: Secondary | ICD-10-CM | POA: Diagnosis not present

## 2019-12-03 DIAGNOSIS — W19XXXA Unspecified fall, initial encounter: Secondary | ICD-10-CM

## 2019-12-03 DIAGNOSIS — Z515 Encounter for palliative care: Secondary | ICD-10-CM

## 2019-12-03 DIAGNOSIS — I350 Nonrheumatic aortic (valve) stenosis: Secondary | ICD-10-CM | POA: Diagnosis not present

## 2019-12-03 DIAGNOSIS — K5641 Fecal impaction: Secondary | ICD-10-CM

## 2019-12-03 DIAGNOSIS — Z7189 Other specified counseling: Secondary | ICD-10-CM

## 2019-12-03 DIAGNOSIS — R778 Other specified abnormalities of plasma proteins: Secondary | ICD-10-CM | POA: Diagnosis present

## 2019-12-03 DIAGNOSIS — R609 Edema, unspecified: Secondary | ICD-10-CM | POA: Diagnosis not present

## 2019-12-03 DIAGNOSIS — Z87891 Personal history of nicotine dependence: Secondary | ICD-10-CM | POA: Diagnosis not present

## 2019-12-03 DIAGNOSIS — K838 Other specified diseases of biliary tract: Secondary | ICD-10-CM | POA: Diagnosis not present

## 2019-12-03 DIAGNOSIS — Y92009 Unspecified place in unspecified non-institutional (private) residence as the place of occurrence of the external cause: Secondary | ICD-10-CM

## 2019-12-03 DIAGNOSIS — E785 Hyperlipidemia, unspecified: Secondary | ICD-10-CM | POA: Diagnosis not present

## 2019-12-03 DIAGNOSIS — S4991XA Unspecified injury of right shoulder and upper arm, initial encounter: Secondary | ICD-10-CM | POA: Diagnosis not present

## 2019-12-03 DIAGNOSIS — Z7901 Long term (current) use of anticoagulants: Secondary | ICD-10-CM

## 2019-12-03 DIAGNOSIS — E872 Acidosis, unspecified: Secondary | ICD-10-CM

## 2019-12-03 DIAGNOSIS — S199XXA Unspecified injury of neck, initial encounter: Secondary | ICD-10-CM | POA: Diagnosis not present

## 2019-12-03 DIAGNOSIS — Z66 Do not resuscitate: Secondary | ICD-10-CM | POA: Diagnosis present

## 2019-12-03 DIAGNOSIS — I2510 Atherosclerotic heart disease of native coronary artery without angina pectoris: Secondary | ICD-10-CM | POA: Diagnosis not present

## 2019-12-03 DIAGNOSIS — T796XXA Traumatic ischemia of muscle, initial encounter: Secondary | ICD-10-CM | POA: Diagnosis not present

## 2019-12-03 DIAGNOSIS — G9341 Metabolic encephalopathy: Secondary | ICD-10-CM | POA: Diagnosis present

## 2019-12-03 DIAGNOSIS — I10 Essential (primary) hypertension: Secondary | ICD-10-CM | POA: Diagnosis not present

## 2019-12-03 DIAGNOSIS — I340 Nonrheumatic mitral (valve) insufficiency: Secondary | ICD-10-CM | POA: Diagnosis not present

## 2019-12-03 DIAGNOSIS — R7989 Other specified abnormal findings of blood chemistry: Secondary | ICD-10-CM

## 2019-12-03 DIAGNOSIS — R52 Pain, unspecified: Secondary | ICD-10-CM | POA: Diagnosis not present

## 2019-12-03 DIAGNOSIS — F0390 Unspecified dementia without behavioral disturbance: Secondary | ICD-10-CM | POA: Diagnosis present

## 2019-12-03 DIAGNOSIS — K219 Gastro-esophageal reflux disease without esophagitis: Secondary | ICD-10-CM | POA: Diagnosis present

## 2019-12-03 DIAGNOSIS — F0391 Unspecified dementia with behavioral disturbance: Secondary | ICD-10-CM | POA: Diagnosis not present

## 2019-12-03 DIAGNOSIS — Z20822 Contact with and (suspected) exposure to covid-19: Secondary | ICD-10-CM | POA: Diagnosis not present

## 2019-12-03 DIAGNOSIS — S3993XA Unspecified injury of pelvis, initial encounter: Secondary | ICD-10-CM | POA: Diagnosis not present

## 2019-12-03 DIAGNOSIS — Z03818 Encounter for observation for suspected exposure to other biological agents ruled out: Secondary | ICD-10-CM | POA: Diagnosis not present

## 2019-12-03 DIAGNOSIS — S0993XA Unspecified injury of face, initial encounter: Secondary | ICD-10-CM | POA: Diagnosis not present

## 2019-12-03 DIAGNOSIS — I252 Old myocardial infarction: Secondary | ICD-10-CM

## 2019-12-03 DIAGNOSIS — M6282 Rhabdomyolysis: Secondary | ICD-10-CM | POA: Diagnosis present

## 2019-12-03 DIAGNOSIS — M25511 Pain in right shoulder: Secondary | ICD-10-CM | POA: Diagnosis not present

## 2019-12-03 DIAGNOSIS — N179 Acute kidney failure, unspecified: Secondary | ICD-10-CM | POA: Diagnosis present

## 2019-12-03 DIAGNOSIS — S299XXA Unspecified injury of thorax, initial encounter: Secondary | ICD-10-CM | POA: Diagnosis not present

## 2019-12-03 DIAGNOSIS — R0902 Hypoxemia: Secondary | ICD-10-CM | POA: Diagnosis not present

## 2019-12-03 DIAGNOSIS — J90 Pleural effusion, not elsewhere classified: Secondary | ICD-10-CM | POA: Diagnosis not present

## 2019-12-03 LAB — CBC
HCT: 42.4 % (ref 39.0–52.0)
Hemoglobin: 13.1 g/dL (ref 13.0–17.0)
MCH: 31.6 pg (ref 26.0–34.0)
MCHC: 30.9 g/dL (ref 30.0–36.0)
MCV: 102.4 fL — ABNORMAL HIGH (ref 80.0–100.0)
Platelets: 108 10*3/uL — ABNORMAL LOW (ref 150–400)
RBC: 4.14 MIL/uL — ABNORMAL LOW (ref 4.22–5.81)
RDW: 15.1 % (ref 11.5–15.5)
WBC: 20.6 10*3/uL — ABNORMAL HIGH (ref 4.0–10.5)
nRBC: 0 % (ref 0.0–0.2)

## 2019-12-03 LAB — COMPREHENSIVE METABOLIC PANEL
ALT: 51 U/L — ABNORMAL HIGH (ref 0–44)
AST: 152 U/L — ABNORMAL HIGH (ref 15–41)
Albumin: 2.8 g/dL — ABNORMAL LOW (ref 3.5–5.0)
Alkaline Phosphatase: 52 U/L (ref 38–126)
Anion gap: 11 (ref 5–15)
BUN: 42 mg/dL — ABNORMAL HIGH (ref 8–23)
CO2: 21 mmol/L — ABNORMAL LOW (ref 22–32)
Calcium: 8.3 mg/dL — ABNORMAL LOW (ref 8.9–10.3)
Chloride: 109 mmol/L (ref 98–111)
Creatinine, Ser: 1.98 mg/dL — ABNORMAL HIGH (ref 0.61–1.24)
GFR calc Af Amer: 33 mL/min — ABNORMAL LOW (ref >60)
GFR calc non Af Amer: 28 mL/min — ABNORMAL LOW (ref >60)
Glucose, Bld: 89 mg/dL (ref 70–99)
Potassium: 5.9 mmol/L — ABNORMAL HIGH (ref 3.5–5.1)
Sodium: 141 mmol/L (ref 135–145)
Total Bilirubin: 1.7 mg/dL — ABNORMAL HIGH (ref 0.3–1.2)
Total Protein: 5.9 g/dL — ABNORMAL LOW (ref 6.5–8.1)

## 2019-12-03 LAB — URINALYSIS, ROUTINE W REFLEX MICROSCOPIC
Bilirubin Urine: NEGATIVE
Glucose, UA: NEGATIVE mg/dL
Ketones, ur: NEGATIVE mg/dL
Leukocytes,Ua: NEGATIVE
Nitrite: NEGATIVE
Protein, ur: 30 mg/dL — AB
Specific Gravity, Urine: 1.018 (ref 1.005–1.030)
pH: 5 (ref 5.0–8.0)

## 2019-12-03 LAB — I-STAT CHEM 8, ED
BUN: 61 mg/dL — ABNORMAL HIGH (ref 8–23)
Calcium, Ion: 1.07 mmol/L — ABNORMAL LOW (ref 1.15–1.40)
Chloride: 110 mmol/L (ref 98–111)
Creatinine, Ser: 2.1 mg/dL — ABNORMAL HIGH (ref 0.61–1.24)
Glucose, Bld: 91 mg/dL (ref 70–99)
HCT: 46 % (ref 39.0–52.0)
Hemoglobin: 15.6 g/dL (ref 13.0–17.0)
Potassium: 5.1 mmol/L (ref 3.5–5.1)
Sodium: 141 mmol/L (ref 135–145)
TCO2: 26 mmol/L (ref 22–32)

## 2019-12-03 LAB — CBG MONITORING, ED: Glucose-Capillary: 83 mg/dL (ref 70–99)

## 2019-12-03 LAB — PROTIME-INR
INR: 1.1 (ref 0.8–1.2)
Prothrombin Time: 14.1 seconds (ref 11.4–15.2)

## 2019-12-03 LAB — LACTIC ACID, PLASMA
Lactic Acid, Venous: 4.5 mmol/L (ref 0.5–1.9)
Lactic Acid, Venous: 1.7 mmol/L (ref 0.5–1.9)

## 2019-12-03 LAB — SAMPLE TO BLOOD BANK

## 2019-12-03 LAB — CK: Total CK: 3222 U/L — ABNORMAL HIGH (ref 49–397)

## 2019-12-03 LAB — CDS SEROLOGY

## 2019-12-03 LAB — ETHANOL: Alcohol, Ethyl (B): <10 mg/dL (ref <10)

## 2019-12-03 MED ORDER — SODIUM CHLORIDE 0.9 % IV BOLUS
1000.0000 mL | Freq: Once | INTRAVENOUS | Status: AC
  Administered 2019-12-03: 21:00:00 1000 mL via INTRAVENOUS

## 2019-12-03 MED ORDER — SODIUM CHLORIDE 0.9 % IV BOLUS
500.0000 mL | Freq: Once | INTRAVENOUS | Status: AC
  Administered 2019-12-04: 02:00:00 500 mL via INTRAVENOUS

## 2019-12-03 MED ORDER — SODIUM CHLORIDE 0.9 % IV SOLN
2.0000 g | Freq: Once | INTRAVENOUS | Status: AC
  Administered 2019-12-04: 01:00:00 2 g via INTRAVENOUS
  Filled 2019-12-03: qty 2

## 2019-12-03 MED ORDER — VANCOMYCIN HCL IN DEXTROSE 1-5 GM/200ML-% IV SOLN
1000.0000 mg | INTRAVENOUS | Status: DC
  Administered 2019-12-04 – 2019-12-07 (×3): 1000 mg via INTRAVENOUS
  Filled 2019-12-03 (×3): qty 200

## 2019-12-03 NOTE — H&P (Addendum)
History and Physical    Leonard Simpson ALP:[id number] DOB: 10-27-1923 DOA: 11/18/2019  PCP: [facility] Patient coming from: Home  Chief Complaint: Altered mental status, fall  HPI: Leonard Simpson is a 86 y.o. male with medical history significant of CAD, hypertension, hyperlipidemia, GERD, BPH presenting to the ED via EMS for evaluation of altered mental status and fall.  Per EMS report, family found the patient on the floor in his house.  He was last known well 2 days ago.  Patient noted to have multiple sores all over his body.  Patient is currently confused and disoriented.  He mentions falling but does not remember what happened.  He is not able to tell me if he is having pain anywhere.  No additional history could be obtained from him.  Son at bedside states patient is normally AAO x4 and lives independently.  His son does help him with ADLs.  He was last seen normal 2 days ago.  When his son went to check on him today around 3 PM he was found on the floor laying on his right side.  ED Course: Vital signs stable on arrival.  WBC count 20.6.  Lactic acid 4.5.  Platelet count 108.  BUN 61, creatinine 2.1.  No recent labs for comparison.  AST 152, ALT 51, T bili 1.7.  Alk phos normal.  CK 3222.  Blood ethanol level undetectable.  UA not suggestive of infection.  SARS-CoV-2 PCR test pending.  Blood culture x2 pending. Chest x-ray showing cardiomegaly without edema or focal pulmonary opacity.  Chronic elevation of left diaphragm.  Fibrosis/scarring suspected at both bases. Pelvic x-ray (limited evaluation) showing no definite acute osseous abnormality. CT head negative for acute intracranial abnormality. CT C-spine negative for acute fracture. CT maxillofacial negative for acute facial bone fracture. CT chest with findings concerning for fibrotic lung disease, a superimposed atypical infectious process such as viral pneumonia not excluded.  Masslike airspace opacity in the right  upper lobe, may represent an area of consolidation, however, an underlying mass is not excluded.  66-monthfollow-up chest CT recommended. CT abdomen pelvis showing ectatic abdominal aorta at risk for aneurysm development, currently measuring 2.8 cm.  Follow-up ultrasound recommended in 5 years per ACR consensus guidelines.  Also showing cholelithiasis with dilated common bile duct measuring up to 1.4 cm.  No CT evidence of acute cholecystitis.  Further evaluation with MRCP recommended.  CT also showing large amount of well-formed stool at the level of the rectum, may represent fecal impaction. X-ray of right shoulder showing age-indeterminate rotator cuff disease but no acute fracture or dislocation.  Patient received vancomycin, cefepime, and 1.5 L fluid boluses.  Review of Systems:  All systems reviewed and apart from history of presenting illness, are negative.  Past Medical History:  Diagnosis Date  . Chest pain    SHARP, KNIFE-LIKE PAIN   . Coronary artery disease   . GERD (gastroesophageal reflux disease)   . Hypercholesterolemia   . Hypertension   . Left ventricular dysfunction   [place]  . SOB (shortness of breath)     Past Surgical History:  Procedure Laterality Date  . APPENDECTOMY    . CARDIAC CATHETERIZATION  05/2010   NONOBSTRUCTIVE DISEASE  . CATARACT EXTRACTION       reports that he has never smoked. He quit smokeless tobacco use about 38 years ago. He reports that he does not drink alcohol or use drugs.  No Known Allergies  History  reviewed. No pertinent family history.  Prior to Admission medications   Medication Sig Start Date End Date Taking? Authorizing Provider  calcium carbonate (TUMS EX) 750 MG chewable tablet Chew 1-2 tablets by mouth as needed for heartburn.   Yes [provider]  Carboxymethylcellulose Sod PF 0.25 % SOLN Place 1 drop into both eyes 2 (two) times daily as needed (for dryness).   Yes [provider]  docusate sodium (COLACE) 100 MG capsule Take 200 mg by mouth daily as needed for mild constipation.   Yes [provider]  magnesium hydroxide (MILK OF MAGNESIA) 400 MG/5ML suspension Take 30 mLs by mouth daily as needed for mild constipation.   Yes [provider]  omeprazole (PRILOSEC) 20 MG capsule Take 20 mg by mouth daily before breakfast.   Yes [provider]  senna (SENOKOT) 8.6 MG TABS tablet Take 1 tablet by mouth daily as needed for mild constipation.   Yes [provider]  tamsulosin (FLOMAX) 0.4 MG CAPS capsule Take 0.4 mg by mouth daily.   Yes [provider]  clopidogrel (PLAVIX) 75 MG tablet TAKE 1 TABLET BY MOUTH EVERY DAY Patient not taking: Reported on 11/27/2019 08/17/13   [place]ique, Peter M, MD  lisinopril (PRINIVIL,ZESTRIL) 20 MG tablet TAKE 1 TABLET BY MOUTH EVERY DAY Patient not taking: Reported on 11/16/2019 08/17/13   [place], Peter M, MD  nitroGLYCERIN (NITROSTAT) 0.4 MG SL tablet Place 0.4 mg under the tongue every 5 (five) minutes as needed for chest pain.     [provider]  simvastatin (ZOCOR) 80 MG tablet TAKE 1 TABLET BY MOUTH EVERY DAY Patient not taking: Reported on 11/27/2019 12/31/13   [place], Peter M, MD    Physical Exam: Vitals:   12/04/19 0020 12/04/19 0030 12/04/19 0041 12/04/19 0056  BP:    128/60  Pulse: 93 81 88 88  Resp: 11 20    Temp:      TempSrc:      SpO2: 98% 98%  96%  Weight:      Height:        Physical Exam  Constitutional: No distress.  HENT:  Head: Normocephalic.  Eyes: Right eye exhibits no discharge. Left eye exhibits no discharge.  Cardiovascular: Normal rate, regular rhythm and intact distal pulses.  Pulmonary/Chest: Effort normal. No respiratory distress. He has no wheezes. He has no rales.  Abdominal: Soft. Bowel sounds are normal. He exhibits no distension. There is no abdominal tenderness. There is no guarding.  Musculoskeletal:        General: No edema.      Cervical back: Neck supple.  Neurological:  Awake Disoriented Not following commands appropriately   Skin: Skin is warm and dry. He is not diaphoretic.  Multiple bruises and signs of skin breakdown noted on the right side of the body including face, hip, and lower extremity. Mild right sided facial swelling.      Labs on Admission: I have personally reviewed following labs and imaging studies  CBC: Recent Labs  Lab 12/07/2019 1905 11/23/2019 1955  WBC  --  20.6*  HGB 15.6 13.1  HCT 46.0 42.4  MCV  --  102.4*  PLT  --  215*   Basic Metabolic Panel: Recent Labs  Lab 11/22/2019 1905 11/10/2019 1955  NA 141 141  K 5.1 5.9*  CL 110 109  CO2  --  21*  GLUCOSE 91 89  BUN 61* 42*  CREATININE 2.10* 1.98*  CALCIUM  --  8.3*  GFR: Estimated Creatinine Clearance: 19.5 mL/min (A) (by C-G formula based on SCr of 1.98 mg/dL (H)). Liver Function Tests: Recent Labs  Lab 11/25/2019 1955  AST 152*  ALT 51*  ALKPHOS 52  BILITOT 1.7*  PROT 5.9*  ALBUMIN 2.8*   No results for input(s): LIPASE, AMYLASE in the last 168 hours. No results for input(s): AMMONIA in the last 168 hours. Coagulation Profile: Recent Labs  Lab 12/07/2019 2011  INR 1.1   Cardiac Enzymes: Recent Labs  Lab 12/05/2019 1955  CKTOTAL 3,222*   BNP (last 3 results) No results for input(s): PROBNP in the last 8760 hours. HbA1C: No results for input(s): HGBA1C in the last 72 hours. CBG: Recent Labs  Lab 11/18/2019 1846  GLUCAP 83   Lipid Profile: No results for input(s): CHOL, HDL, LDLCALC, TRIG, CHOLHDL, LDLDIRECT in the last 72 hours. Thyroid Function Tests: No results for input(s): TSH, T4TOTAL, FREET4, T3FREE, THYROIDAB in the last 72 hours. Anemia Panel: No results for input(s): VITAMINB12, FOLATE, FERRITIN, TIBC, IRON, RETICCTPCT in the last 72 hours. Urine analysis:    Component Value Date/Time   COLORURINE YELLOW 11/18/2019 1955   APPEARANCEUR HAZY (A) 11/16/2019 1955   LABSPEC 1.018 12/04/2019  1955   PHURINE 5.0 11/27/2019 1955   GLUCOSEU NEGATIVE 11/15/2019 1955   HGBUR LARGE (A) 11/28/2019 [place] NEGATIVE 11/22/2019 [place] NEGATIVE 11/21/2019 1955   PROTEINUR 30 (A) 11/19/2019 1955   NITRITE NEGATIVE 11/20/2019 1955   LEUKOCYTESUR NEGATIVE 11/14/2019 1955    Radiological Exams on Admission: CT ABDOMEN PELVIS WO CONTRAST  Result Date: 11/15/2019 CLINICAL DATA:  Trauma. EXAM: CT HEAD WITHOUT CONTRAST CT MAXILLOFACIAL WITHOUT CONTRAST CT CERVICAL SPINE WITHOUT CONTRAST CT CHEST, ABDOMEN AND PELVIS WITHOUT CONTRAST TECHNIQUE: Contiguous axial images were obtained from the base of the skull through the vertex without intravenous contrast. Multidetector CT imaging of the maxillofacial structures was performed. Multiplanar CT image reconstructions were also generated. A small metallic BB was placed on the right temple in order to reliably differentiate right from left. Multidetector CT imaging of the cervical spine was performed without intravenous contrast. Multiplanar CT image reconstructions were also generated. Multidetector CT imaging of the chest, abdomen and pelvis was performed following the standard protocol without IV contrast. COMPARISON:  None. FINDINGS: CT HEAD FINDINGS Brain: No evidence of acute infarction, hemorrhage, hydrocephalus, extra-axial collection or mass lesion/mass effect. There is extensive volume loss in addition to extensive chronic microvascular ischemic changes. 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 Vascular: No hyperdense vessel or unexpected calcification. Skull: Normal. Negative for fracture or focal lesion. Other: None. CT MAXILLOFACIAL FINDINGS Osseous: The evaluation of the osseous structures is significantly limited by motion artifact. Given this limitation, there is no definite acute osseous abnormality. Orbits: Negative. No traumatic or inflammatory finding. Sinuses: Clear. Soft tissues: Negative. CT CERVICAL SPINE  FINDINGS Alignment: Normal. Skull base and vertebrae: No acute fracture. No primary bone lesion or focal pathologic process. Soft tissues and spinal canal: No prevertebral fluid or swelling. No visible canal hematoma. Disc levels: Multilevel degenerative changes are noted throughout the cervical spine, greatest at the C5-C6 and C6-C7 levels. Other: None. CT CHEST FINDINGS Cardiovascular: Heart size is normal. Advanced atherosclerotic changes are noted of the thoracic aorta and coronary arteries. There is no significant pericardial effusion. Mediastinum/Nodes: --No mediastinal or hilar lymphadenopathy. --No axillary lymphadenopathy. --No supraclavicular lymphadenopathy. --Normal thyroid gland. --The esophagus is unremarkable Lungs/Pleura: There are areas of honeycombing involving the lung bases and lung  apices. There are coarse reticular lung markings bilaterally. There is a somewhat masslike airspace opacity involving the right upper lobe measuring approximately 1.8 x 2.2 cm (axial series 5, image 57). The trachea is unremarkable. There is no pneumothorax or significant pleural effusion. The left hemidiaphragm is elevated. Musculoskeletal: No chest wall abnormality. No acute or significant osseous findings. CT ABDOMEN AND PELVIS FINDINGS Hepatobiliary: The liver is normal. Cholelithiasis without acute inflammation.The common bile duct is dilated measuring up to approximately 1.4 cm. Pancreas: Normal contours without ductal dilatation. No peripancreatic fluid collection. Spleen: No splenic laceration or hematoma. Adrenals/Urinary Tract: --Adrenal glands: No adrenal hemorrhage. --Right kidney/ureter: No hydronephrosis or perinephric hematoma. --Left kidney/ureter: No hydronephrosis or perinephric hematoma. --Urinary bladder: The bladder is decompressed with a Foley catheter. Stomach/Bowel: --Stomach/Duodenum: No hiatal hernia or other gastric abnormality. Normal duodenal course and caliber. --Small bowel: No  dilatation or inflammation. --Colon: There is a large amount of well-formed stool at the level of the rectum. There is a moderate amount of stool throughout the remaining portions of the colon. --Appendix: Not visualized. No right lower quadrant inflammation or free fluid. Vascular/Lymphatic: Advanced vascular calcifications are noted. The infrarenal abdominal aorta measures up to approximately 2.8 cm in diameter. --No retroperitoneal lymphadenopathy. --No mesenteric lymphadenopathy. --No pelvic or inguinal lymphadenopathy. Reproductive: The prostate gland is enlarged. Other: No ascites or free air. There is mild body wall edema. Musculoskeletal. No acute displaced fractures. IMPRESSION: 1. No acute intracranial abnormality. Advanced atrophy and chronic microvascular ischemic changes are noted. 2. No acute cervical spine fracture. 3. No acute facial bone fracture, however evaluation was limited by significant motion artifact. 4. Multiple lung findings are noted as detailed above with differential considerations fibrotic lung disease with a superimposed atypical infectious process such as viral pneumonia not excluded. 5. There is a masslike airspace opacity in the right upper lobe as detailed above. This may represent an area of consolidation, however an underlying mass is not excluded. A three-month follow-up CT of the chest is recommended. 6. Ectatic abdominal aorta at risk for aneurysm development, currently measuring 2.8 cm. Recommend followup by ultrasound in 5 years. This recommendation follows ACR consensus guidelines: White Paper of the ACR Incidental Findings Committee II on Vascular Findings. J Am Coll Radiol 2013; 10:789-794. Aortic aneurysm NOS (ICD10-I71.9) 7. Cholelithiasis with dilated common bile duct measuring up to 1.4 cm. No CT evidence of acute cholecystitis. If there is clinical concern for acute biliary pathology, recommend further evaluation with MRCP with the patient is clinically stable. An  emergent MRCP would likely be degraded by significant motion artifact. 8. Large amount of well-formed stool at the level of the rectum. This may represent fecal impaction. 9. Aortic Atherosclerosis (ICD10-I70.0). Electronically Signed   By: Constance Holster M.D.   On: 12/04/2019 21:17   CT Head Wo Contrast  Result Date: 11/30/2019 CLINICAL DATA:  Trauma. EXAM: CT HEAD WITHOUT CONTRAST CT MAXILLOFACIAL WITHOUT CONTRAST CT CERVICAL SPINE WITHOUT CONTRAST CT CHEST, ABDOMEN AND PELVIS WITHOUT CONTRAST TECHNIQUE: Contiguous axial images were obtained from the base of the skull through the vertex without intravenous contrast. Multidetector CT imaging of the maxillofacial structures was performed. Multiplanar CT image reconstructions were also generated. A small metallic BB was placed on the right temple in order to reliably differentiate right from left. Multidetector CT imaging of the cervical spine was performed without intravenous contrast. Multiplanar CT image reconstructions were also generated. Multidetector CT imaging of the chest, abdomen and pelvis was performed following the standard protocol without IV  contrast. COMPARISON:  None. FINDINGS: CT HEAD FINDINGS Brain: No evidence of acute infarction, hemorrhage, hydrocephalus, extra-axial collection or mass lesion/mass effect. There is extensive volume loss in addition to extensive chronic microvascular ischemic changes. 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 Vascular: No hyperdense vessel or unexpected calcification. Skull: Normal. Negative for fracture or focal lesion. Other: None. CT MAXILLOFACIAL FINDINGS Osseous: The evaluation of the osseous structures is significantly limited by motion artifact. Given this limitation, there is no definite acute osseous abnormality. Orbits: Negative. No traumatic or inflammatory finding. Sinuses: Clear. Soft tissues: Negative. CT CERVICAL SPINE FINDINGS Alignment: Normal. Skull base and vertebrae: No  acute fracture. No primary bone lesion or focal pathologic process. Soft tissues and spinal canal: No prevertebral fluid or swelling. No visible canal hematoma. Disc levels: Multilevel degenerative changes are noted throughout the cervical spine, greatest at the C5-C6 and C6-C7 levels. Other: None. CT CHEST FINDINGS Cardiovascular: Heart size is normal. Advanced atherosclerotic changes are noted of the thoracic aorta and coronary arteries. There is no significant pericardial effusion. Mediastinum/Nodes: --No mediastinal or hilar lymphadenopathy. --No axillary lymphadenopathy. --No supraclavicular lymphadenopathy. --Normal thyroid gland. --The esophagus is unremarkable Lungs/Pleura: There are areas of honeycombing involving the lung bases and lung apices. There are coarse reticular lung markings bilaterally. There is a somewhat masslike airspace opacity involving the right upper lobe measuring approximately 1.8 x 2.2 cm (axial series 5, image 57). The trachea is unremarkable. There is no pneumothorax or significant pleural effusion. The left hemidiaphragm is elevated. Musculoskeletal: No chest wall abnormality. No acute or significant osseous findings. CT ABDOMEN AND PELVIS FINDINGS Hepatobiliary: The liver is normal. Cholelithiasis without acute inflammation.The common bile duct is dilated measuring up to approximately 1.4 cm. Pancreas: Normal contours without ductal dilatation. No peripancreatic fluid collection. Spleen: No splenic laceration or hematoma. Adrenals/Urinary Tract: --Adrenal glands: No adrenal hemorrhage. --Right kidney/ureter: No hydronephrosis or perinephric hematoma. --Left kidney/ureter: No hydronephrosis or perinephric hematoma. --Urinary bladder: The bladder is decompressed with a Foley catheter. Stomach/Bowel: --Stomach/Duodenum: No hiatal hernia or other gastric abnormality. Normal duodenal course and caliber. --Small bowel: No dilatation or inflammation. --Colon: There is a large amount of  well-formed stool at the level of the rectum. There is a moderate amount of stool throughout the remaining portions of the colon. --Appendix: Not visualized. No right lower quadrant inflammation or free fluid. Vascular/Lymphatic: Advanced vascular calcifications are noted. The infrarenal abdominal aorta measures up to approximately 2.8 cm in diameter. --No retroperitoneal lymphadenopathy. --No mesenteric lymphadenopathy. --No pelvic or inguinal lymphadenopathy. Reproductive: The prostate gland is enlarged. Other: No ascites or free air. There is mild body wall edema. Musculoskeletal. No acute displaced fractures. IMPRESSION: 1. No acute intracranial abnormality. Advanced atrophy and chronic microvascular ischemic changes are noted. 2. No acute cervical spine fracture. 3. No acute facial bone fracture, however evaluation was limited by significant motion artifact. 4. Multiple lung findings are noted as detailed above with differential considerations fibrotic lung disease with a superimposed atypical infectious process such as viral pneumonia not excluded. 5. There is a masslike airspace opacity in the right upper lobe as detailed above. This may represent an area of consolidation, however an underlying mass is not excluded. A three-month follow-up CT of the chest is recommended. 6. Ectatic abdominal aorta at risk for aneurysm development, currently measuring 2.8 cm. Recommend followup by ultrasound in 5 years. This recommendation follows ACR consensus guidelines: White Paper of the ACR Incidental Findings Committee II on Vascular Findings. J Am Coll Radiol 2013; 10:789-794.  Aortic aneurysm NOS (ICD10-I71.9) 7. Cholelithiasis with dilated common bile duct measuring up to 1.4 cm. No CT evidence of acute cholecystitis. If there is clinical concern for acute biliary pathology, recommend further evaluation with MRCP with the patient is clinically stable. An emergent MRCP would likely be degraded by significant motion  artifact. 8. Large amount of well-formed stool at the level of the rectum. This may represent fecal impaction. 9. Aortic Atherosclerosis (ICD10-I70.0). Electronically Signed   By: Constance Holster M.D.   On: 11/22/2019 21:17   CT CHEST WO CONTRAST  Result Date: 11/18/2019 CLINICAL DATA:  Trauma. EXAM: CT HEAD WITHOUT CONTRAST CT MAXILLOFACIAL WITHOUT CONTRAST CT CERVICAL SPINE WITHOUT CONTRAST CT CHEST, ABDOMEN AND PELVIS WITHOUT CONTRAST TECHNIQUE: Contiguous axial images were obtained from the base of the skull through the vertex without intravenous contrast. Multidetector CT imaging of the maxillofacial structures was performed. Multiplanar CT image reconstructions were also generated. A small metallic BB was placed on the right temple in order to reliably differentiate right from left. Multidetector CT imaging of the cervical spine was performed without intravenous contrast. Multiplanar CT image reconstructions were also generated. Multidetector CT imaging of the chest, abdomen and pelvis was performed following the standard protocol without IV contrast. COMPARISON:  None. FINDINGS: CT HEAD FINDINGS Brain: No evidence of acute infarction, hemorrhage, hydrocephalus, extra-axial collection or mass lesion/mass effect. There is extensive volume loss in addition to extensive chronic microvascular ischemic changes. 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 Vascular: No hyperdense vessel or unexpected calcification. Skull: Normal. Negative for fracture or focal lesion. Other: None. CT MAXILLOFACIAL FINDINGS Osseous: The evaluation of the osseous structures is significantly limited by motion artifact. Given this limitation, there is no definite acute osseous abnormality. Orbits: Negative. No traumatic or inflammatory finding. Sinuses: Clear. Soft tissues: Negative. CT CERVICAL SPINE FINDINGS Alignment: Normal. Skull base and vertebrae: No acute fracture. No primary bone lesion or focal pathologic  process. Soft tissues and spinal canal: No prevertebral fluid or swelling. No visible canal hematoma. Disc levels: Multilevel degenerative changes are noted throughout the cervical spine, greatest at the C5-C6 and C6-C7 levels. Other: None. CT CHEST FINDINGS Cardiovascular: Heart size is normal. Advanced atherosclerotic changes are noted of the thoracic aorta and coronary arteries. There is no significant pericardial effusion. Mediastinum/Nodes: --No mediastinal or hilar lymphadenopathy. --No axillary lymphadenopathy. --No supraclavicular lymphadenopathy. --Normal thyroid gland. --The esophagus is unremarkable Lungs/Pleura: There are areas of honeycombing involving the lung bases and lung apices. There are coarse reticular lung markings bilaterally. There is a somewhat masslike airspace opacity involving the right upper lobe measuring approximately 1.8 x 2.2 cm (axial series 5, image 57). The trachea is unremarkable. There is no pneumothorax or significant pleural effusion. The left hemidiaphragm is elevated. Musculoskeletal: No chest wall abnormality. No acute or significant osseous findings. CT ABDOMEN AND PELVIS FINDINGS Hepatobiliary: The liver is normal. Cholelithiasis without acute inflammation.The common bile duct is dilated measuring up to approximately 1.4 cm. Pancreas: Normal contours without ductal dilatation. No peripancreatic fluid collection. Spleen: No splenic laceration or hematoma. Adrenals/Urinary Tract: --Adrenal glands: No adrenal hemorrhage. --Right kidney/ureter: No hydronephrosis or perinephric hematoma. --Left kidney/ureter: No hydronephrosis or perinephric hematoma. --Urinary bladder: The bladder is decompressed with a Foley catheter. Stomach/Bowel: --Stomach/Duodenum: No hiatal hernia or other gastric abnormality. Normal duodenal course and caliber. --Small bowel: No dilatation or inflammation. --Colon: There is a large amount of well-formed stool at the level of the rectum. There is a  moderate amount of stool throughout the  remaining portions of the colon. --Appendix: Not visualized. No right lower quadrant inflammation or free fluid. Vascular/Lymphatic: Advanced vascular calcifications are noted. The infrarenal abdominal aorta measures up to approximately 2.8 cm in diameter. --No retroperitoneal lymphadenopathy. --No mesenteric lymphadenopathy. --No pelvic or inguinal lymphadenopathy. Reproductive: The prostate gland is enlarged. Other: No ascites or free air. There is mild body wall edema. Musculoskeletal. No acute displaced fractures. IMPRESSION: 1. No acute intracranial abnormality. Advanced atrophy and chronic microvascular ischemic changes are noted. 2. No acute cervical spine fracture. 3. No acute facial bone fracture, however evaluation was limited by significant motion artifact. 4. Multiple lung findings are noted as detailed above with differential considerations fibrotic lung disease with a superimposed atypical infectious process such as viral pneumonia not excluded. 5. There is a masslike airspace opacity in the right upper lobe as detailed above. This may represent an area of consolidation, however an underlying mass is not excluded. A three-month follow-up CT of the chest is recommended. 6. Ectatic abdominal aorta at risk for aneurysm development, currently measuring 2.8 cm. Recommend followup by ultrasound in 5 years. This recommendation follows ACR consensus guidelines: White Paper of the ACR Incidental Findings Committee II on Vascular Findings. J Am Coll Radiol 2013; 10:789-794. Aortic aneurysm NOS (ICD10-I71.9) 7. Cholelithiasis with dilated common bile duct measuring up to 1.4 cm. No CT evidence of acute cholecystitis. If there is clinical concern for acute biliary pathology, recommend further evaluation with MRCP with the patient is clinically stable. An emergent MRCP would likely be degraded by significant motion artifact. 8. Large amount of well-formed stool at the level  of the rectum. This may represent fecal impaction. 9. Aortic Atherosclerosis (ICD10-I70.0). Electronically Signed   By: Constance Holster M.D.   On: 12/02/2019 21:17   CT Cervical Spine Wo Contrast  Result Date: 11/17/2019 CLINICAL DATA:  Trauma. EXAM: CT HEAD WITHOUT CONTRAST CT MAXILLOFACIAL WITHOUT CONTRAST CT CERVICAL SPINE WITHOUT CONTRAST CT CHEST, ABDOMEN AND PELVIS WITHOUT CONTRAST TECHNIQUE: Contiguous axial images were obtained from the base of the skull through the vertex without intravenous contrast. Multidetector CT imaging of the maxillofacial structures was performed. Multiplanar CT image reconstructions were also generated. A small metallic BB was placed on the right temple in order to reliably differentiate right from left. Multidetector CT imaging of the cervical spine was performed without intravenous contrast. Multiplanar CT image reconstructions were also generated. Multidetector CT imaging of the chest, abdomen and pelvis was performed following the standard protocol without IV contrast. COMPARISON:  None. FINDINGS: CT HEAD FINDINGS Brain: No evidence of acute infarction, hemorrhage, hydrocephalus, extra-axial collection or mass lesion/mass effect. There is extensive volume loss in addition to extensive chronic microvascular ischemic changes. 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 Vascular: No hyperdense vessel or unexpected calcification. Skull: Normal. Negative for fracture or focal lesion. Other: None. CT MAXILLOFACIAL FINDINGS Osseous: The evaluation of the osseous structures is significantly limited by motion artifact. Given this limitation, there is no definite acute osseous abnormality. Orbits: Negative. No traumatic or inflammatory finding. Sinuses: Clear. Soft tissues: Negative. CT CERVICAL SPINE FINDINGS Alignment: Normal. Skull base and vertebrae: No acute fracture. No primary bone lesion or focal pathologic process. Soft tissues and spinal canal: No  prevertebral fluid or swelling. No visible canal hematoma. Disc levels: Multilevel degenerative changes are noted throughout the cervical spine, greatest at the C5-C6 and C6-C7 levels. Other: None. CT CHEST FINDINGS Cardiovascular: Heart size is normal. Advanced atherosclerotic changes are noted of the thoracic aorta and coronary arteries.  There is no significant pericardial effusion. Mediastinum/Nodes: --No mediastinal or hilar lymphadenopathy. --No axillary lymphadenopathy. --No supraclavicular lymphadenopathy. --Normal thyroid gland. --The esophagus is unremarkable Lungs/Pleura: There are areas of honeycombing involving the lung bases and lung apices. There are coarse reticular lung markings bilaterally. There is a somewhat masslike airspace opacity involving the right upper lobe measuring approximately 1.8 x 2.2 cm (axial series 5, image 57). The trachea is unremarkable. There is no pneumothorax or significant pleural effusion. The left hemidiaphragm is elevated. Musculoskeletal: No chest wall abnormality. No acute or significant osseous findings. CT ABDOMEN AND PELVIS FINDINGS Hepatobiliary: The liver is normal. Cholelithiasis without acute inflammation.The common bile duct is dilated measuring up to approximately 1.4 cm. Pancreas: Normal contours without ductal dilatation. No peripancreatic fluid collection. Spleen: No splenic laceration or hematoma. Adrenals/Urinary Tract: --Adrenal glands: No adrenal hemorrhage. --Right kidney/ureter: No hydronephrosis or perinephric hematoma. --Left kidney/ureter: No hydronephrosis or perinephric hematoma. --Urinary bladder: The bladder is decompressed with a Foley catheter. Stomach/Bowel: --Stomach/Duodenum: No hiatal hernia or other gastric abnormality. Normal duodenal course and caliber. --Small bowel: No dilatation or inflammation. --Colon: There is a large amount of well-formed stool at the level of the rectum. There is a moderate amount of stool throughout the  remaining portions of the colon. --Appendix: Not visualized. No right lower quadrant inflammation or free fluid. Vascular/Lymphatic: Advanced vascular calcifications are noted. The infrarenal abdominal aorta measures up to approximately 2.8 cm in diameter. --No retroperitoneal lymphadenopathy. --No mesenteric lymphadenopathy. --No pelvic or inguinal lymphadenopathy. Reproductive: The prostate gland is enlarged. Other: No ascites or free air. There is mild body wall edema. Musculoskeletal. No acute displaced fractures. IMPRESSION: 1. No acute intracranial abnormality. Advanced atrophy and chronic microvascular ischemic changes are noted. 2. No acute cervical spine fracture. 3. No acute facial bone fracture, however evaluation was limited by significant motion artifact. 4. Multiple lung findings are noted as detailed above with differential considerations fibrotic lung disease with a superimposed atypical infectious process such as viral pneumonia not excluded. 5. There is a masslike airspace opacity in the right upper lobe as detailed above. This may represent an area of consolidation, however an underlying mass is not excluded. A three-month follow-up CT of the chest is recommended. 6. Ectatic abdominal aorta at risk for aneurysm development, currently measuring 2.8 cm. Recommend followup by ultrasound in 5 years. This recommendation follows ACR consensus guidelines: White Paper of the ACR Incidental Findings Committee II on Vascular Findings. J Am Coll Radiol 2013; 10:789-794. Aortic aneurysm NOS (ICD10-I71.9) 7. Cholelithiasis with dilated common bile duct measuring up to 1.4 cm. No CT evidence of acute cholecystitis. If there is clinical concern for acute biliary pathology, recommend further evaluation with MRCP with the patient is clinically stable. An emergent MRCP would likely be degraded by significant motion artifact. 8. Large amount of well-formed stool at the level of the rectum. This may represent fecal  impaction. 9. Aortic Atherosclerosis (ICD10-I70.0). Electronically Signed   By: Constance Holster M.D.   On: 11/11/2019 21:17   DG Pelvis Portable  Result Date: 11/17/2019 CLINICAL DATA:  Fall EXAM: PORTABLE PELVIS 1-2 VIEWS COMPARISON:  None. FINDINGS: SI joints are non widened. Numerous phleboliths in the pelvis. Pubic symphysis and rami are intact. The femoral necks are obscured by the trochanter right greater than left. Both femoral heads project in joint. No definitive fracture seen. IMPRESSION: 1. Limited evaluation of right greater than left femoral neck due to positioning and obscuration of the neck by the trochanter. 2. No definite acute  osseous abnormality is seen. Electronically Signed   By: Donavan Foil M.D.   On: 11/15/2019 19:37   DG Chest Port 1 View  Result Date: 11/18/2019 CLINICAL DATA:  Fall EXAM: PORTABLE CHEST 1 VIEW COMPARISON:  05/15/2010 FINDINGS: Chronic elevation of left diaphragm. Mild reticular opacity at the right CP angle and base, likely scarring/fibrosis. Linear scarring or atelectasis left base. Mild cardiomegaly with aortic atherosclerosis. No consolidation, pleural effusion, or pneumothorax. IMPRESSION: 1. Cardiomegaly without edema or focal pulmonary opacity 2. Chronic elevation of left diaphragm. Fibrosis/scarring suspected at both bases. Electronically Signed   By: Donavan Foil M.D.   On: 11/28/2019 19:36   DG Shoulder Right Portable  Result Date: 11/17/2019 CLINICAL DATA:  [place] today with right shoulder pain. EXAM: PORTABLE RIGHT SHOULDER COMPARISON:  None. FINDINGS: The shoulder is held in relative internal rotation. The humeral head is properly located. Narrowed humeral acromial distance which could indicate rotator cuff pathology, either chronic or acute. AC joint unremarkable. Regional ribs are negative. IMPRESSION: No acute fracture or dislocation. Narrowed humeral acromial distance suggesting rotator cuff disease, age indeterminate. Electronically Signed    By: Nelson Chimes M.D.   On: 11/13/2019 22:18   CT Maxillofacial Wo Contrast  Result Date: 12/07/2019 CLINICAL DATA:  Trauma. EXAM: CT HEAD WITHOUT CONTRAST CT MAXILLOFACIAL WITHOUT CONTRAST CT CERVICAL SPINE WITHOUT CONTRAST CT CHEST, ABDOMEN AND PELVIS WITHOUT CONTRAST TECHNIQUE: Contiguous axial images were obtained from the base of the skull through the vertex without intravenous contrast. Multidetector CT imaging of the maxillofacial structures was performed. Multiplanar CT image reconstructions were also generated. A small metallic BB was placed on the right temple in order to reliably differentiate right from left. Multidetector CT imaging of the cervical spine was performed without intravenous contrast. Multiplanar CT image reconstructions were also generated. Multidetector CT imaging of the chest, abdomen and pelvis was performed following the standard protocol without IV contrast. COMPARISON:  None. FINDINGS: CT HEAD FINDINGS Brain: No evidence of acute infarction, hemorrhage, hydrocephalus, extra-axial collection or mass lesion/mass effect. There is extensive volume loss in addition to extensive chronic microvascular ischemic changes. 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 Vascular: No hyperdense vessel or unexpected calcification. Skull: Normal. Negative for fracture or focal lesion. Other: None. CT MAXILLOFACIAL FINDINGS Osseous: The evaluation of the osseous structures is significantly limited by motion artifact. Given this limitation, there is no definite acute osseous abnormality. Orbits: Negative. No traumatic or inflammatory finding. Sinuses: Clear. Soft tissues: Negative. CT CERVICAL SPINE FINDINGS Alignment: Normal. Skull base and vertebrae: No acute fracture. No primary bone lesion or focal pathologic process. Soft tissues and spinal canal: No prevertebral fluid or swelling. No visible canal hematoma. Disc levels: Multilevel degenerative changes are noted throughout the  cervical spine, greatest at the C5-C6 and C6-C7 levels. Other: None. CT CHEST FINDINGS Cardiovascular: Heart size is normal. Advanced atherosclerotic changes are noted of the thoracic aorta and coronary arteries. There is no significant pericardial effusion. Mediastinum/Nodes: --No mediastinal or hilar lymphadenopathy. --No axillary lymphadenopathy. --No supraclavicular lymphadenopathy. --Normal thyroid gland. --The esophagus is unremarkable Lungs/Pleura: There are areas of honeycombing involving the lung bases and lung apices. There are coarse reticular lung markings bilaterally. There is a somewhat masslike airspace opacity involving the right upper lobe measuring approximately 1.8 x 2.2 cm (axial series 5, image 57). The trachea is unremarkable. There is no pneumothorax or significant pleural effusion. The left hemidiaphragm is elevated. Musculoskeletal: No chest wall abnormality. No acute or significant osseous findings. CT ABDOMEN AND  PELVIS FINDINGS Hepatobiliary: The liver is normal. Cholelithiasis without acute inflammation.The common bile duct is dilated measuring up to approximately 1.4 cm. Pancreas: Normal contours without ductal dilatation. No peripancreatic fluid collection. Spleen: No splenic laceration or hematoma. Adrenals/Urinary Tract: --Adrenal glands: No adrenal hemorrhage. --Right kidney/ureter: No hydronephrosis or perinephric hematoma. --Left kidney/ureter: No hydronephrosis or perinephric hematoma. --Urinary bladder: The bladder is decompressed with a Foley catheter. Stomach/Bowel: --Stomach/Duodenum: No hiatal hernia or other gastric abnormality. Normal duodenal course and caliber. --Small bowel: No dilatation or inflammation. --Colon: There is a large amount of well-formed stool at the level of the rectum. There is a moderate amount of stool throughout the remaining portions of the colon. --Appendix: Not visualized. No right lower quadrant inflammation or free fluid. Vascular/Lymphatic:  Advanced vascular calcifications are noted. The infrarenal abdominal aorta measures up to approximately 2.8 cm in diameter. --No retroperitoneal lymphadenopathy. --No mesenteric lymphadenopathy. --No pelvic or inguinal lymphadenopathy. Reproductive: The prostate gland is enlarged. Other: No ascites or free air. There is mild body wall edema. Musculoskeletal. No acute displaced fractures. IMPRESSION: 1. No acute intracranial abnormality. Advanced atrophy and chronic microvascular ischemic changes are noted. 2. No acute cervical spine fracture. 3. No acute facial bone fracture, however evaluation was limited by significant motion artifact. 4. Multiple lung findings are noted as detailed above with differential considerations fibrotic lung disease with a superimposed atypical infectious process such as viral pneumonia not excluded. 5. There is a masslike airspace opacity in the right upper lobe as detailed above. This may represent an area of consolidation, however an underlying mass is not excluded. A three-month follow-up CT of the chest is recommended. 6. Ectatic abdominal aorta at risk for aneurysm development, currently measuring 2.8 cm. Recommend followup by ultrasound in 5 years. This recommendation follows ACR consensus guidelines: White Paper of the ACR Incidental Findings Committee II on Vascular Findings. J Am Coll Radiol 2013; 10:789-794. Aortic aneurysm NOS (ICD10-I71.9) 7. Cholelithiasis with dilated common bile duct measuring up to 1.4 cm. No CT evidence of acute cholecystitis. If there is clinical concern for acute biliary pathology, recommend further evaluation with MRCP with the patient is clinically stable. An emergent MRCP would likely be degraded by significant motion artifact. 8. Large amount of well-formed stool at the level of the rectum. This may represent fecal impaction. 9. Aortic Atherosclerosis (ICD10-I70.0). Electronically Signed   By: Constance Holster M.D.   On: 11/28/2019 21:17     EKG: Independently reviewed.  Sinus rhythm, PVCs.  LBBB.  QTc 522.  No recent EKG for comparison.  Assessment/Plan Principal Problem:   Rhabdomyolysis Active Problems:   AKI (acute kidney injury) ([place])   Pneumonia   Acute metabolic encephalopathy   Elevated LFTs   Traumatic rhabdomyolysis: Patient had a fall at home and was on the floor for several days. Has diffuse swelling to the right of his body including his face and multiple bruises/skin breakdown which appears to be consistent with him laying down on this side for multiple days.  CK elevated at 3222. Plan: Continue IV fluid hydration and continue to monitor CK level.  AKI: Suspect due to rhabdomyolysis. BUN 61, creatinine 2.1.  No recent labs for comparison.  Plan is to continue IV fluid hydration.  Continue to monitor renal function and urine output.  Avoid nephrotoxic agents.  Possible sepsis secondary to pneumonia: Vital signs stable. WBC count 20.6.  Does have lactic acidosis which could also be due to rhabdomyolysis and severe dehydration.  Lactic acidosis resolved after IV fluid  boluses.  CT chest with findings concerning for fibrotic lung disease, a superimposed atypical infectious process such as viral pneumonia not excluded.  Masslike airspace opacity in the right upper lobe, may represent an area of consolidation, however, an underlying mass is not excluded.  8-monthfollow-up chest CT recommended.  Plan is to continue antibiotics including vancomycin and cefepime at this time.  Check procalcitonin level. Blood culture x2 pending.  Continuous pulse ox, supplemental oxygen if needed.  SARS-CoV-2 PCR test pending.  Continue airborne and contact precautions.  Continue to monitor WBC count.  Acute metabolic encephalopathy: Suspect related to acute illness-pneumonia and severe dehydration.  Head CT negative for acute intracranial abnormality. Currently very confused and disoriented. Not following commands appropriately, Continue  management of pneumonia and rhabdomyolysis as mentioned above. Check TSH, B12, and NH3 levels.  Elevated LFTs: Suspect due to rhabdomyolysis although CT does show cholelithiasis with dilated CBD measuring up to 1.4 cm.  Will order right upper quadrant ultrasound at this time, may need MRCP in a.m. for further evaluation.  Continue to monitor LFTs.  Ectatic abdominal aorta: CT abdomen pelvis showing ectatic abdominal aorta at risk for aneurysm development, currently measuring 2.8 cm.  Follow-up ultrasound recommended in 5 years per ACR consensus guidelines.    Constipation: CT also showing large amount of well-formed stool at the level of the rectum, may represent fecal impaction.  Oral laxatives, if no improvement may need enema and/or manual disimpaction.  LBBB on EKG: Patient has a history of CAD.  Status post stenting of the right coronary during an acute inferior myocardial infarction in 2009.  Subsequent cardiac catheterization in 2011 showed nonobstructive disease.  Currently very confused and I am not able to assess whether he is having any anginal symptoms. No recent EKG for comparison. Continue cardiac monitoring and check high-sensitivity troponin level. Addendum: High-sensitivity troponin significantly elevated at 9690.  Spoke to Dr. APaticia Stackfrom cardiology who has reviewed the patient's EKG and feels there is no significant change compared to prior EKGs.  He feels the troponin elevation is likely due to rhabdomyolysis rather than ACS.  Recommending holding off starting heparin at this time and continuing to trend troponin.  If there is a significant rise in repeat troponin, then start heparin for anticoagulation.  Patient is felt not to be a candidate for cardiac catheterization given advanced age and DNR status.  Recommending obtaining echocardiogram in a.m.  QT prolongation on EKG: Continue cardiac monitoring.  Keep potassium level above 4 and magnesium above 2.  Avoid QT prolonging drugs if  possible.  Repeat EKG in a.m.  Recent fall/physical deconditioning: PT and OT evaluation  DVT prophylaxis: Subcutaneous heparin Code Status: DNR. Discussed with patient's son Leonard Simpson At bedside.  Family Communication: Son updated.  Disposition Plan: Anticipate discharge to SNF after clinical improvement. Consults called: None Admission status: It is my clinical opinion that admission to INPATIENT is reasonable and necessary because of the expectation that this patient will require hospital care that crosses at least 2 midnights to treat this condition based on the medical complexity of the problems presented.  Given the aforementioned information, the predictability of an adverse outcome is felt to be significant.  The medical decision making on this patient was of high complexity and the patient is at high risk for clinical deterioration, therefore this is a level 3 visit.  VShela LeffMD [facility]  If 7PM-7AM, please contact night-coverage [email or web address]  12/04/2019, 1:10 AM

## 2019-12-03 NOTE — ED Triage Notes (Signed)
Pt to ED via [facility] after family called ref pt being found at his house on the floor.  Pt was last known well 2 days ago.  Pt has multiple sores all over his body

## 2019-12-03 NOTE — ED Provider Notes (Signed)
[facility] EMERGENCY DEPARTMENT Provider Note   CSN: [id number] Arrival date & time: 11/11/2019  1839     History Chief Complaint  Patient presents with  . Altered Mental Status  . Fall    Leonard Simpson is a 86 y.o. male history CAD, GERD, hypertension, high cholesterol.  Patient arrives via EMS today after being found on the floor of his home, last known well was 2 days ago.  On my initial evaluation patient is elderly, frail, he appears slightly confused.  He reports that he has been lying on his floor for 3 days on his right side and could not get up, he cannot tell me why exactly he fell to the ground.  It appears through chart review that patient is on Eliquis.  He reports pain to his right side, right face and right shoulder, mild pain constant worse with movement and palpation improved with rest.  He denies any loss of consciousness, chest pain or abdominal pain.  He does ramble when he speaks and repeatedly begins talking about his wife who passed away several years ago.  He follows basic commands well with equal strength bilaterally.  HPI     Past Medical History:  Diagnosis Date  . Chest pain    SHARP, KNIFE-LIKE PAIN   . Coronary artery disease   . GERD (gastroesophageal reflux disease)   . Hypercholesterolemia   . Hypertension   . Left ventricular dysfunction   . MI (myocardial infarction) (HCC) 2009  . SOB (shortness of breath)     Patient Active Problem List   Diagnosis Date Noted  . Severe sepsis (HCC) 11/26/2019  . Seasonal allergic rhinitis 01/12/2011  . Chest pain   . SOB (shortness of breath)   . Coronary artery disease   . MI (myocardial infarction) (HCC)   . Hypertension   . Hypercholesterolemia   . GERD (gastroesophageal reflux disease)   . Left ventricular dysfunction     Past Surgical History:  Procedure Laterality Date  . APPENDECTOMY    . CARDIAC CATHETERIZATION  05/2010   NONOBSTRUCTIVE DISEASE  . CATARACT  EXTRACTION        No family history on file.  Social History   Tobacco Use  . Smoking status: Never Smoker  . Smokeless tobacco: Former Engineer, water Use Topics  . Alcohol use: No  . Drug use: No    Home Medications Prior to Admission medications   Medication Sig Start Date End Date Taking? Authorizing Provider  calcium carbonate (TUMS EX) 750 MG chewable tablet Chew 1-2 tablets by mouth as needed for heartburn.   Yes [provider]  Carboxymethylcellulose Sod PF 0.25 % SOLN Place 1 drop into both eyes 2 (two) times daily as needed (for dryness).   Yes [provider]  docusate sodium (COLACE) 100 MG capsule Take 200 mg by mouth daily as needed for mild constipation.   Yes [provider]  magnesium hydroxide (MILK OF MAGNESIA) 400 MG/5ML suspension Take 30 mLs by mouth daily as needed for mild constipation.   Yes [provider]  omeprazole (PRILOSEC) 20 MG capsule Take 20 mg by mouth daily before breakfast.   Yes [provider]  senna (SENOKOT) 8.6 MG TABS tablet Take 1 tablet by mouth daily as needed for mild constipation.   Yes [provider]  tamsulosin (FLOMAX) 0.4 MG CAPS capsule Take 0.4 mg by mouth daily.   Yes [provider]  clopidogrel (PLAVIX) 75 MG tablet TAKE  1 TABLET BY MOUTH EVERY DAY Patient not taking: Reported on 12/27/2019 08/17/13   [place], Peter M, MD  lisinopril (PRINIVIL,ZESTRIL) 20 MG tablet TAKE 1 TABLET BY MOUTH EVERY DAY Patient not taking: Reported on 27-Dec-2019 08/17/13   [place], Peter M, MD  nitroGLYCERIN (NITROSTAT) 0.4 MG SL tablet Place 0.4 mg under the tongue every 5 (five) minutes as needed for chest pain.     [provider]  simvastatin (ZOCOR) 80 MG tablet TAKE 1 TABLET BY MOUTH EVERY DAY Patient not taking: Reported on December 27, 2019 12/31/13   [place], Peter M, MD    Allergies    Patient has no known allergies.  Review of Systems   Review of Systems  Unable to perform  ROS: Mental status change    Physical Exam Updated Vital Signs BP (!) 151/91   Pulse 92   Temp 98.2 F (36.8 C) (Rectal)   Resp 15   Ht 5\' 6"  (1.676 m)   Wt 59 kg   SpO2 96%   BMI 20.98 kg/m   Physical Exam Constitutional:      General: He is awake.     Appearance: He is well-developed. He is ill-appearing. He is not toxic-appearing.  HENT:     Head: Normocephalic. Contusion present. No Battle's sign.     Jaw: There is normal jaw occlusion. No trismus.     Comments: Mild diffuse edema to the right side of the face    Right Ear: External ear normal.     Left Ear: External ear normal.     Nose: Nose normal.     Right Nostril: No epistaxis.     Left Nostril: No epistaxis.     Mouth/Throat:     Mouth: Mucous membranes are moist.  Eyes:     General: Vision grossly intact. Gaze aligned appropriately.     Extraocular Movements: Extraocular movements intact.     Conjunctiva/sclera: Conjunctivae normal.  Neck:     Trachea: Trachea and phonation normal.  Cardiovascular:     Rate and Rhythm: Normal rate and regular rhythm.  Pulmonary:     Effort: Pulmonary effort is normal. No accessory muscle usage or respiratory distress.     Breath sounds: Normal air entry.     Comments: Coarse lung sounds bilaterally Chest:     Chest wall: No deformity or tenderness.     Comments: Bruising of various age Abdominal:     Palpations: Abdomen is soft.     Tenderness: There is no abdominal tenderness. There is no guarding or rebound.     Comments: Small bruises of various range  Musculoskeletal:     Cervical back: Neck supple. No spinous process tenderness or muscular tenderness.     Comments: All major joints palpated without gross deformity or tenderness.  Neurovascular intact to all 4 extremities.  Good sensation and capillary refill.  Follows commands with equal grip strength and dorsi/plantar flexion bilaterally.  Hips stable to compression bilaterally without pain.  Feet:     Right  foot:     Protective Sensation: 3 sites tested. 3 sites sensed.     Left foot:     Protective Sensation: 3 sites tested. 3 sites sensed.  Skin:    General: Skin is warm and dry.     Comments: Multiple ulcerations primarily on right side of the body.  Neurological:     Mental Status: He is alert. He is confused.     GCS: GCS eye subscore is 4. GCS verbal subscore  is 4. GCS motor subscore is 6.  Psychiatric:        Behavior: Behavior is cooperative.     ED Results / Procedures / Treatments   Labs (all labs ordered are listed, but only abnormal results are displayed) Labs Reviewed  COMPREHENSIVE METABOLIC PANEL - Abnormal; Notable for the following components:      Result Value   Potassium 5.9 (*)    CO2 21 (*)    BUN 42 (*)    Creatinine, Ser 1.98 (*)    Calcium 8.3 (*)    Total Protein 5.9 (*)    Albumin 2.8 (*)    AST 152 (*)    ALT 51 (*)    Total Bilirubin 1.7 (*)    GFR calc non Af Amer 28 (*)    GFR calc Af Amer 33 (*)    All other components within normal limits  CBC - Abnormal; Notable for the following components:   WBC 20.6 (*)    RBC 4.14 (*)    MCV 102.4 (*)    Platelets 108 (*)    All other components within normal limits  URINALYSIS, ROUTINE W REFLEX MICROSCOPIC - Abnormal; Notable for the following components:   APPearance HAZY (*)    Hgb urine dipstick LARGE (*)    Protein, ur 30 (*)    Bacteria, UA RARE (*)    All other components within normal limits  LACTIC ACID, PLASMA - Abnormal; Notable for the following components:   Lactic Acid, Venous 4.5 (*)    All other components within normal limits  CK - Abnormal; Notable for the following components:   Total CK 3,222 (*)    All other components within normal limits  I-STAT CHEM 8, ED - Abnormal; Notable for the following components:   BUN 61 (*)    Creatinine, Ser 2.10 (*)    Calcium, Ion 1.07 (*)    All other components within normal limits  SARS CORONAVIRUS 2 (TAT 6-24 HRS)  CULTURE, BLOOD  (ROUTINE X 2)  CULTURE, BLOOD (ROUTINE X 2)  ETHANOL  CDS SEROLOGY  PROTIME-INR  LACTIC ACID, PLASMA  LACTIC ACID, PLASMA  CBG MONITORING, ED  SAMPLE TO BLOOD BANK    EKG None  Radiology CT ABDOMEN PELVIS WO CONTRAST  Result Date: 12-16-2019 CLINICAL DATA:  Trauma. EXAM: CT HEAD WITHOUT CONTRAST CT MAXILLOFACIAL WITHOUT CONTRAST CT CERVICAL SPINE WITHOUT CONTRAST CT CHEST, ABDOMEN AND PELVIS WITHOUT CONTRAST TECHNIQUE: Contiguous axial images were obtained from the base of the skull through the vertex without intravenous contrast. Multidetector CT imaging of the maxillofacial structures was performed. Multiplanar CT image reconstructions were also generated. A small metallic BB was placed on the right temple in order to reliably differentiate right from left. Multidetector CT imaging of the cervical spine was performed without intravenous contrast. Multiplanar CT image reconstructions were also generated. Multidetector CT imaging of the chest, abdomen and pelvis was performed following the standard protocol without IV contrast. COMPARISON:  None. FINDINGS: CT HEAD FINDINGS Brain: No evidence of acute infarction, hemorrhage, hydrocephalus, extra-axial collection or mass lesion/mass effect. There is extensive volume loss in addition to extensive chronic microvascular ischemic changes. 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 Vascular: No hyperdense vessel or unexpected calcification. Skull: Normal. Negative for fracture or focal lesion. Other: None. CT MAXILLOFACIAL FINDINGS Osseous: The evaluation of the osseous structures is significantly limited by motion artifact. Given this limitation, there is no definite acute osseous abnormality. Orbits: Negative. No traumatic or  inflammatory finding. Sinuses: Clear. Soft tissues: Negative. CT CERVICAL SPINE FINDINGS Alignment: Normal. Skull base and vertebrae: No acute fracture. No primary bone lesion or focal pathologic process. Soft  tissues and spinal canal: No prevertebral fluid or swelling. No visible canal hematoma. Disc levels: Multilevel degenerative changes are noted throughout the cervical spine, greatest at the C5-C6 and C6-C7 levels. Other: None. CT CHEST FINDINGS Cardiovascular: Heart size is normal. Advanced atherosclerotic changes are noted of the thoracic aorta and coronary arteries. There is no significant pericardial effusion. Mediastinum/Nodes: --No mediastinal or hilar lymphadenopathy. --No axillary lymphadenopathy. --No supraclavicular lymphadenopathy. --Normal thyroid gland. --The esophagus is unremarkable Lungs/Pleura: There are areas of honeycombing involving the lung bases and lung apices. There are coarse reticular lung markings bilaterally. There is a somewhat masslike airspace opacity involving the right upper lobe measuring approximately 1.8 x 2.2 cm (axial series 5, image 57). The trachea is unremarkable. There is no pneumothorax or significant pleural effusion. The left hemidiaphragm is elevated. Musculoskeletal: No chest wall abnormality. No acute or significant osseous findings. CT ABDOMEN AND PELVIS FINDINGS Hepatobiliary: The liver is normal. Cholelithiasis without acute inflammation.The common bile duct is dilated measuring up to approximately 1.4 cm. Pancreas: Normal contours without ductal dilatation. No peripancreatic fluid collection. Spleen: No splenic laceration or hematoma. Adrenals/Urinary Tract: --Adrenal glands: No adrenal hemorrhage. --Right kidney/ureter: No hydronephrosis or perinephric hematoma. --Left kidney/ureter: No hydronephrosis or perinephric hematoma. --Urinary bladder: The bladder is decompressed with a Foley catheter. Stomach/Bowel: --Stomach/Duodenum: No hiatal hernia or other gastric abnormality. Normal duodenal course and caliber. --Small bowel: No dilatation or inflammation. --Colon: There is a large amount of well-formed stool at the level of the rectum. There is a moderate amount of  stool throughout the remaining portions of the colon. --Appendix: Not visualized. No right lower quadrant inflammation or free fluid. Vascular/Lymphatic: Advanced vascular calcifications are noted. The infrarenal abdominal aorta measures up to approximately 2.8 cm in diameter. --No retroperitoneal lymphadenopathy. --No mesenteric lymphadenopathy. --No pelvic or inguinal lymphadenopathy. Reproductive: The prostate gland is enlarged. Other: No ascites or free air. There is mild body wall edema. Musculoskeletal. No acute displaced fractures. IMPRESSION: 1. No acute intracranial abnormality. Advanced atrophy and chronic microvascular ischemic changes are noted. 2. No acute cervical spine fracture. 3. No acute facial bone fracture, however evaluation was limited by significant motion artifact. 4. Multiple lung findings are noted as detailed above with differential considerations fibrotic lung disease with a superimposed atypical infectious process such as viral pneumonia not excluded. 5. There is a masslike airspace opacity in the right upper lobe as detailed above. This may represent an area of consolidation, however an underlying mass is not excluded. A three-month follow-up CT of the chest is recommended. 6. Ectatic abdominal aorta at risk for aneurysm development, currently measuring 2.8 cm. Recommend followup by ultrasound in 5 years. This recommendation follows ACR consensus guidelines: White Paper of the ACR Incidental Findings Committee II on Vascular Findings. J Am Coll Radiol 2013; 10:789-794. Aortic aneurysm NOS (ICD10-I71.9) 7. Cholelithiasis with dilated common bile duct measuring up to 1.4 cm. No CT evidence of acute cholecystitis. If there is clinical concern for acute biliary pathology, recommend further evaluation with MRCP with the patient is clinically stable. An emergent MRCP would likely be degraded by significant motion artifact. 8. Large amount of well-formed stool at the level of the rectum. This  may represent fecal impaction. 9. Aortic Atherosclerosis (ICD10-I70.0). Electronically Signed   By: Katherine Mantle M.D.   On: 11/28/2019 21:17   CT  Head Wo Contrast  Result Date: 11/30/2019 CLINICAL DATA:  Trauma. EXAM: CT HEAD WITHOUT CONTRAST CT MAXILLOFACIAL WITHOUT CONTRAST CT CERVICAL SPINE WITHOUT CONTRAST CT CHEST, ABDOMEN AND PELVIS WITHOUT CONTRAST TECHNIQUE: Contiguous axial images were obtained from the base of the skull through the vertex without intravenous contrast. Multidetector CT imaging of the maxillofacial structures was performed. Multiplanar CT image reconstructions were also generated. A small metallic BB was placed on the right temple in order to reliably differentiate right from left. Multidetector CT imaging of the cervical spine was performed without intravenous contrast. Multiplanar CT image reconstructions were also generated. Multidetector CT imaging of the chest, abdomen and pelvis was performed following the standard protocol without IV contrast. COMPARISON:  None. FINDINGS: CT HEAD FINDINGS Brain: No evidence of acute infarction, hemorrhage, hydrocephalus, extra-axial collection or mass lesion/mass effect. There is extensive volume loss in addition to extensive chronic microvascular ischemic changes. 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 Vascular: No hyperdense vessel or unexpected calcification. Skull: Normal. Negative for fracture or focal lesion. Other: None. CT MAXILLOFACIAL FINDINGS Osseous: The evaluation of the osseous structures is significantly limited by motion artifact. Given this limitation, there is no definite acute osseous abnormality. Orbits: Negative. No traumatic or inflammatory finding. Sinuses: Clear. Soft tissues: Negative. CT CERVICAL SPINE FINDINGS Alignment: Normal. Skull base and vertebrae: No acute fracture. No primary bone lesion or focal pathologic process. Soft tissues and spinal canal: No prevertebral fluid or swelling. No  visible canal hematoma. Disc levels: Multilevel degenerative changes are noted throughout the cervical spine, greatest at the C5-C6 and C6-C7 levels. Other: None. CT CHEST FINDINGS Cardiovascular: Heart size is normal. Advanced atherosclerotic changes are noted of the thoracic aorta and coronary arteries. There is no significant pericardial effusion. Mediastinum/Nodes: --No mediastinal or hilar lymphadenopathy. --No axillary lymphadenopathy. --No supraclavicular lymphadenopathy. --Normal thyroid gland. --The esophagus is unremarkable Lungs/Pleura: There are areas of honeycombing involving the lung bases and lung apices. There are coarse reticular lung markings bilaterally. There is a somewhat masslike airspace opacity involving the right upper lobe measuring approximately 1.8 x 2.2 cm (axial series 5, image 57). The trachea is unremarkable. There is no pneumothorax or significant pleural effusion. The left hemidiaphragm is elevated. Musculoskeletal: No chest wall abnormality. No acute or significant osseous findings. CT ABDOMEN AND PELVIS FINDINGS Hepatobiliary: The liver is normal. Cholelithiasis without acute inflammation.The common bile duct is dilated measuring up to approximately 1.4 cm. Pancreas: Normal contours without ductal dilatation. No peripancreatic fluid collection. Spleen: No splenic laceration or hematoma. Adrenals/Urinary Tract: --Adrenal glands: No adrenal hemorrhage. --Right kidney/ureter: No hydronephrosis or perinephric hematoma. --Left kidney/ureter: No hydronephrosis or perinephric hematoma. --Urinary bladder: The bladder is decompressed with a Foley catheter. Stomach/Bowel: --Stomach/Duodenum: No hiatal hernia or other gastric abnormality. Normal duodenal course and caliber. --Small bowel: No dilatation or inflammation. --Colon: There is a large amount of well-formed stool at the level of the rectum. There is a moderate amount of stool throughout the remaining portions of the colon.  --Appendix: Not visualized. No right lower quadrant inflammation or free fluid. Vascular/Lymphatic: Advanced vascular calcifications are noted. The infrarenal abdominal aorta measures up to approximately 2.8 cm in diameter. --No retroperitoneal lymphadenopathy. --No mesenteric lymphadenopathy. --No pelvic or inguinal lymphadenopathy. Reproductive: The prostate gland is enlarged. Other: No ascites or free air. There is mild body wall edema. Musculoskeletal. No acute displaced fractures. IMPRESSION: 1. No acute intracranial abnormality. Advanced atrophy and chronic microvascular ischemic changes are noted. 2. No acute cervical spine fracture. 3. No acute  facial bone fracture, however evaluation was limited by significant motion artifact. 4. Multiple lung findings are noted as detailed above with differential considerations fibrotic lung disease with a superimposed atypical infectious process such as viral pneumonia not excluded. 5. There is a masslike airspace opacity in the right upper lobe as detailed above. This may represent an area of consolidation, however an underlying mass is not excluded. A three-month follow-up CT of the chest is recommended. 6. Ectatic abdominal aorta at risk for aneurysm development, currently measuring 2.8 cm. Recommend followup by ultrasound in 5 years. This recommendation follows ACR consensus guidelines: White Paper of the ACR Incidental Findings Committee II on Vascular Findings. J Am Coll Radiol 2013; 10:789-794. Aortic aneurysm NOS (ICD10-I71.9) 7. Cholelithiasis with dilated common bile duct measuring up to 1.4 cm. No CT evidence of acute cholecystitis. If there is clinical concern for acute biliary pathology, recommend further evaluation with MRCP with the patient is clinically stable. An emergent MRCP would likely be degraded by significant motion artifact. 8. Large amount of well-formed stool at the level of the rectum. This may represent fecal impaction. 9. Aortic  Atherosclerosis (ICD10-I70.0). Electronically Signed   By: Katherine Mantle M.D.   On: December 31, 2019 21:17   CT CHEST WO CONTRAST  Result Date: 2019-12-31 CLINICAL DATA:  Trauma. EXAM: CT HEAD WITHOUT CONTRAST CT MAXILLOFACIAL WITHOUT CONTRAST CT CERVICAL SPINE WITHOUT CONTRAST CT CHEST, ABDOMEN AND PELVIS WITHOUT CONTRAST TECHNIQUE: Contiguous axial images were obtained from the base of the skull through the vertex without intravenous contrast. Multidetector CT imaging of the maxillofacial structures was performed. Multiplanar CT image reconstructions were also generated. A small metallic BB was placed on the right temple in order to reliably differentiate right from left. Multidetector CT imaging of the cervical spine was performed without intravenous contrast. Multiplanar CT image reconstructions were also generated. Multidetector CT imaging of the chest, abdomen and pelvis was performed following the standard protocol without IV contrast. COMPARISON:  None. FINDINGS: CT HEAD FINDINGS Brain: No evidence of acute infarction, hemorrhage, hydrocephalus, extra-axial collection or mass lesion/mass effect. There is extensive volume loss in addition to extensive chronic microvascular ischemic changes. 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 Vascular: No hyperdense vessel or unexpected calcification. Skull: Normal. Negative for fracture or focal lesion. Other: None. CT MAXILLOFACIAL FINDINGS Osseous: The evaluation of the osseous structures is significantly limited by motion artifact. Given this limitation, there is no definite acute osseous abnormality. Orbits: Negative. No traumatic or inflammatory finding. Sinuses: Clear. Soft tissues: Negative. CT CERVICAL SPINE FINDINGS Alignment: Normal. Skull base and vertebrae: No acute fracture. No primary bone lesion or focal pathologic process. Soft tissues and spinal canal: No prevertebral fluid or swelling. No visible canal hematoma. Disc levels:  Multilevel degenerative changes are noted throughout the cervical spine, greatest at the C5-C6 and C6-C7 levels. Other: None. CT CHEST FINDINGS Cardiovascular: Heart size is normal. Advanced atherosclerotic changes are noted of the thoracic aorta and coronary arteries. There is no significant pericardial effusion. Mediastinum/Nodes: --No mediastinal or hilar lymphadenopathy. --No axillary lymphadenopathy. --No supraclavicular lymphadenopathy. --Normal thyroid gland. --The esophagus is unremarkable Lungs/Pleura: There are areas of honeycombing involving the lung bases and lung apices. There are coarse reticular lung markings bilaterally. There is a somewhat masslike airspace opacity involving the right upper lobe measuring approximately 1.8 x 2.2 cm (axial series 5, image 57). The trachea is unremarkable. There is no pneumothorax or significant pleural effusion. The left hemidiaphragm is elevated. Musculoskeletal: No chest wall abnormality. No acute or  significant osseous findings. CT ABDOMEN AND PELVIS FINDINGS Hepatobiliary: The liver is normal. Cholelithiasis without acute inflammation.The common bile duct is dilated measuring up to approximately 1.4 cm. Pancreas: Normal contours without ductal dilatation. No peripancreatic fluid collection. Spleen: No splenic laceration or hematoma. Adrenals/Urinary Tract: --Adrenal glands: No adrenal hemorrhage. --Right kidney/ureter: No hydronephrosis or perinephric hematoma. --Left kidney/ureter: No hydronephrosis or perinephric hematoma. --Urinary bladder: The bladder is decompressed with a Foley catheter. Stomach/Bowel: --Stomach/Duodenum: No hiatal hernia or other gastric abnormality. Normal duodenal course and caliber. --Small bowel: No dilatation or inflammation. --Colon: There is a large amount of well-formed stool at the level of the rectum. There is a moderate amount of stool throughout the remaining portions of the colon. --Appendix: Not visualized. No right lower  quadrant inflammation or free fluid. Vascular/Lymphatic: Advanced vascular calcifications are noted. The infrarenal abdominal aorta measures up to approximately 2.8 cm in diameter. --No retroperitoneal lymphadenopathy. --No mesenteric lymphadenopathy. --No pelvic or inguinal lymphadenopathy. Reproductive: The prostate gland is enlarged. Other: No ascites or free air. There is mild body wall edema. Musculoskeletal. No acute displaced fractures. IMPRESSION: 1. No acute intracranial abnormality. Advanced atrophy and chronic microvascular ischemic changes are noted. 2. No acute cervical spine fracture. 3. No acute facial bone fracture, however evaluation was limited by significant motion artifact. 4. Multiple lung findings are noted as detailed above with differential considerations fibrotic lung disease with a superimposed atypical infectious process such as viral pneumonia not excluded. 5. There is a masslike airspace opacity in the right upper lobe as detailed above. This may represent an area of consolidation, however an underlying mass is not excluded. A three-month follow-up CT of the chest is recommended. 6. Ectatic abdominal aorta at risk for aneurysm development, currently measuring 2.8 cm. Recommend followup by ultrasound in 5 years. This recommendation follows ACR consensus guidelines: White Paper of the ACR Incidental Findings Committee II on Vascular Findings. J Am Coll Radiol 2013; 10:789-794. Aortic aneurysm NOS (ICD10-I71.9) 7. Cholelithiasis with dilated common bile duct measuring up to 1.4 cm. No CT evidence of acute cholecystitis. If there is clinical concern for acute biliary pathology, recommend further evaluation with MRCP with the patient is clinically stable. An emergent MRCP would likely be degraded by significant motion artifact. 8. Large amount of well-formed stool at the level of the rectum. This may represent fecal impaction. 9. Aortic Atherosclerosis (ICD10-I70.0). Electronically Signed    By: Katherine Mantle M.D.   On: 12-12-2019 21:17   CT Cervical Spine Wo Contrast  Result Date: 2019/12/12 CLINICAL DATA:  Trauma. EXAM: CT HEAD WITHOUT CONTRAST CT MAXILLOFACIAL WITHOUT CONTRAST CT CERVICAL SPINE WITHOUT CONTRAST CT CHEST, ABDOMEN AND PELVIS WITHOUT CONTRAST TECHNIQUE: Contiguous axial images were obtained from the base of the skull through the vertex without intravenous contrast. Multidetector CT imaging of the maxillofacial structures was performed. Multiplanar CT image reconstructions were also generated. A small metallic BB was placed on the right temple in order to reliably differentiate right from left. Multidetector CT imaging of the cervical spine was performed without intravenous contrast. Multiplanar CT image reconstructions were also generated. Multidetector CT imaging of the chest, abdomen and pelvis was performed following the standard protocol without IV contrast. COMPARISON:  None. FINDINGS: CT HEAD FINDINGS Brain: No evidence of acute infarction, hemorrhage, hydrocephalus, extra-axial collection or mass lesion/mass effect. There is extensive volume loss in addition to extensive chronic microvascular ischemic changes. 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 Vascular: No hyperdense vessel or unexpected calcification. Skull: Normal. Negative for  fracture or focal lesion. Other: None. CT MAXILLOFACIAL FINDINGS Osseous: The evaluation of the osseous structures is significantly limited by motion artifact. Given this limitation, there is no definite acute osseous abnormality. Orbits: Negative. No traumatic or inflammatory finding. Sinuses: Clear. Soft tissues: Negative. CT CERVICAL SPINE FINDINGS Alignment: Normal. Skull base and vertebrae: No acute fracture. No primary bone lesion or focal pathologic process. Soft tissues and spinal canal: No prevertebral fluid or swelling. No visible canal hematoma. Disc levels: Multilevel degenerative changes are noted throughout  the cervical spine, greatest at the C5-C6 and C6-C7 levels. Other: None. CT CHEST FINDINGS Cardiovascular: Heart size is normal. Advanced atherosclerotic changes are noted of the thoracic aorta and coronary arteries. There is no significant pericardial effusion. Mediastinum/Nodes: --No mediastinal or hilar lymphadenopathy. --No axillary lymphadenopathy. --No supraclavicular lymphadenopathy. --Normal thyroid gland. --The esophagus is unremarkable Lungs/Pleura: There are areas of honeycombing involving the lung bases and lung apices. There are coarse reticular lung markings bilaterally. There is a somewhat masslike airspace opacity involving the right upper lobe measuring approximately 1.8 x 2.2 cm (axial series 5, image 57). The trachea is unremarkable. There is no pneumothorax or significant pleural effusion. The left hemidiaphragm is elevated. Musculoskeletal: No chest wall abnormality. No acute or significant osseous findings. CT ABDOMEN AND PELVIS FINDINGS Hepatobiliary: The liver is normal. Cholelithiasis without acute inflammation.The common bile duct is dilated measuring up to approximately 1.4 cm. Pancreas: Normal contours without ductal dilatation. No peripancreatic fluid collection. Spleen: No splenic laceration or hematoma. Adrenals/Urinary Tract: --Adrenal glands: No adrenal hemorrhage. --Right kidney/ureter: No hydronephrosis or perinephric hematoma. --Left kidney/ureter: No hydronephrosis or perinephric hematoma. --Urinary bladder: The bladder is decompressed with a Foley catheter. Stomach/Bowel: --Stomach/Duodenum: No hiatal hernia or other gastric abnormality. Normal duodenal course and caliber. --Small bowel: No dilatation or inflammation. --Colon: There is a large amount of well-formed stool at the level of the rectum. There is a moderate amount of stool throughout the remaining portions of the colon. --Appendix: Not visualized. No right lower quadrant inflammation or free fluid. Vascular/Lymphatic:  Advanced vascular calcifications are noted. The infrarenal abdominal aorta measures up to approximately 2.8 cm in diameter. --No retroperitoneal lymphadenopathy. --No mesenteric lymphadenopathy. --No pelvic or inguinal lymphadenopathy. Reproductive: The prostate gland is enlarged. Other: No ascites or free air. There is mild body wall edema. Musculoskeletal. No acute displaced fractures. IMPRESSION: 1. No acute intracranial abnormality. Advanced atrophy and chronic microvascular ischemic changes are noted. 2. No acute cervical spine fracture. 3. No acute facial bone fracture, however evaluation was limited by significant motion artifact. 4. Multiple lung findings are noted as detailed above with differential considerations fibrotic lung disease with a superimposed atypical infectious process such as viral pneumonia not excluded. 5. There is a masslike airspace opacity in the right upper lobe as detailed above. This may represent an area of consolidation, however an underlying mass is not excluded. A three-month follow-up CT of the chest is recommended. 6. Ectatic abdominal aorta at risk for aneurysm development, currently measuring 2.8 cm. Recommend followup by ultrasound in 5 years. This recommendation follows ACR consensus guidelines: White Paper of the ACR Incidental Findings Committee II on Vascular Findings. J Am Coll Radiol 2013; 10:789-794. Aortic aneurysm NOS (ICD10-I71.9) 7. Cholelithiasis with dilated common bile duct measuring up to 1.4 cm. No CT evidence of acute cholecystitis. If there is clinical concern for acute biliary pathology, recommend further evaluation with MRCP with the patient is clinically stable. An emergent MRCP would likely be degraded by significant motion artifact. 8. Large  amount of well-formed stool at the level of the rectum. This may represent fecal impaction. 9. Aortic Atherosclerosis (ICD10-I70.0). Electronically Signed   By: Katherine Mantle M.D.   On: 11/17/2019 21:17   DG  Pelvis Portable  Result Date: 11/18/2019 CLINICAL DATA:  Fall EXAM: PORTABLE PELVIS 1-2 VIEWS COMPARISON:  None. FINDINGS: SI joints are non widened. Numerous phleboliths in the pelvis. Pubic symphysis and rami are intact. The femoral necks are obscured by the trochanter right greater than left. Both femoral heads project in joint. No definitive fracture seen. IMPRESSION: 1. Limited evaluation of right greater than left femoral neck due to positioning and obscuration of the neck by the trochanter. 2. No definite acute osseous abnormality is seen. Electronically Signed   By: Jasmine Pang M.D.   On: 11/18/2019 19:37   DG Chest Port 1 View  Result Date: 11/27/2019 CLINICAL DATA:  Fall EXAM: PORTABLE CHEST 1 VIEW COMPARISON:  05/15/2010 FINDINGS: Chronic elevation of left diaphragm. Mild reticular opacity at the right CP angle and base, likely scarring/fibrosis. Linear scarring or atelectasis left base. Mild cardiomegaly with aortic atherosclerosis. No consolidation, pleural effusion, or pneumothorax. IMPRESSION: 1. Cardiomegaly without edema or focal pulmonary opacity 2. Chronic elevation of left diaphragm. Fibrosis/scarring suspected at both bases. Electronically Signed   By: Jasmine Pang M.D.   On: 11/21/2019 19:36   DG Shoulder Right Portable  Result Date: 11/28/2019 CLINICAL DATA:  Larey Seat today with right shoulder pain. EXAM: PORTABLE RIGHT SHOULDER COMPARISON:  None. FINDINGS: The shoulder is held in relative internal rotation. The humeral head is properly located. Narrowed humeral acromial distance which could indicate rotator cuff pathology, either chronic or acute. AC joint unremarkable. Regional ribs are negative. IMPRESSION: No acute fracture or dislocation. Narrowed humeral acromial distance suggesting rotator cuff disease, age indeterminate. Electronically Signed   By: Paulina Fusi M.D.   On: 11/29/2019 22:18   CT Maxillofacial Wo Contrast  Result Date: 11/17/2019 CLINICAL DATA:  Trauma. EXAM:  CT HEAD WITHOUT CONTRAST CT MAXILLOFACIAL WITHOUT CONTRAST CT CERVICAL SPINE WITHOUT CONTRAST CT CHEST, ABDOMEN AND PELVIS WITHOUT CONTRAST TECHNIQUE: Contiguous axial images were obtained from the base of the skull through the vertex without intravenous contrast. Multidetector CT imaging of the maxillofacial structures was performed. Multiplanar CT image reconstructions were also generated. A small metallic BB was placed on the right temple in order to reliably differentiate right from left. Multidetector CT imaging of the cervical spine was performed without intravenous contrast. Multiplanar CT image reconstructions were also generated. Multidetector CT imaging of the chest, abdomen and pelvis was performed following the standard protocol without IV contrast. COMPARISON:  None. FINDINGS: CT HEAD FINDINGS Brain: No evidence of acute infarction, hemorrhage, hydrocephalus, extra-axial collection or mass lesion/mass effect. There is extensive volume loss in addition to extensive chronic microvascular ischemic changes. 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 Vascular: No hyperdense vessel or unexpected calcification. Skull: Normal. Negative for fracture or focal lesion. Other: None. CT MAXILLOFACIAL FINDINGS Osseous: The evaluation of the osseous structures is significantly limited by motion artifact. Given this limitation, there is no definite acute osseous abnormality. Orbits: Negative. No traumatic or inflammatory finding. Sinuses: Clear. Soft tissues: Negative. CT CERVICAL SPINE FINDINGS Alignment: Normal. Skull base and vertebrae: No acute fracture. No primary bone lesion or focal pathologic process. Soft tissues and spinal canal: No prevertebral fluid or swelling. No visible canal hematoma. Disc levels: Multilevel degenerative changes are noted throughout the cervical spine, greatest at the C5-C6 and C6-C7 levels. Other: None.  CT CHEST FINDINGS Cardiovascular: Heart size is normal. Advanced  atherosclerotic changes are noted of the thoracic aorta and coronary arteries. There is no significant pericardial effusion. Mediastinum/Nodes: --No mediastinal or hilar lymphadenopathy. --No axillary lymphadenopathy. --No supraclavicular lymphadenopathy. --Normal thyroid gland. --The esophagus is unremarkable Lungs/Pleura: There are areas of honeycombing involving the lung bases and lung apices. There are coarse reticular lung markings bilaterally. There is a somewhat masslike airspace opacity involving the right upper lobe measuring approximately 1.8 x 2.2 cm (axial series 5, image 57). The trachea is unremarkable. There is no pneumothorax or significant pleural effusion. The left hemidiaphragm is elevated. Musculoskeletal: No chest wall abnormality. No acute or significant osseous findings. CT ABDOMEN AND PELVIS FINDINGS Hepatobiliary: The liver is normal. Cholelithiasis without acute inflammation.The common bile duct is dilated measuring up to approximately 1.4 cm. Pancreas: Normal contours without ductal dilatation. No peripancreatic fluid collection. Spleen: No splenic laceration or hematoma. Adrenals/Urinary Tract: --Adrenal glands: No adrenal hemorrhage. --Right kidney/ureter: No hydronephrosis or perinephric hematoma. --Left kidney/ureter: No hydronephrosis or perinephric hematoma. --Urinary bladder: The bladder is decompressed with a Foley catheter. Stomach/Bowel: --Stomach/Duodenum: No hiatal hernia or other gastric abnormality. Normal duodenal course and caliber. --Small bowel: No dilatation or inflammation. --Colon: There is a large amount of well-formed stool at the level of the rectum. There is a moderate amount of stool throughout the remaining portions of the colon. --Appendix: Not visualized. No right lower quadrant inflammation or free fluid. Vascular/Lymphatic: Advanced vascular calcifications are noted. The infrarenal abdominal aorta measures up to approximately 2.8 cm in diameter. --No  retroperitoneal lymphadenopathy. --No mesenteric lymphadenopathy. --No pelvic or inguinal lymphadenopathy. Reproductive: The prostate gland is enlarged. Other: No ascites or free air. There is mild body wall edema. Musculoskeletal. No acute displaced fractures. IMPRESSION: 1. No acute intracranial abnormality. Advanced atrophy and chronic microvascular ischemic changes are noted. 2. No acute cervical spine fracture. 3. No acute facial bone fracture, however evaluation was limited by significant motion artifact. 4. Multiple lung findings are noted as detailed above with differential considerations fibrotic lung disease with a superimposed atypical infectious process such as viral pneumonia not excluded. 5. There is a masslike airspace opacity in the right upper lobe as detailed above. This may represent an area of consolidation, however an underlying mass is not excluded. A three-month follow-up CT of the chest is recommended. 6. Ectatic abdominal aorta at risk for aneurysm development, currently measuring 2.8 cm. Recommend followup by ultrasound in 5 years. This recommendation follows ACR consensus guidelines: White Paper of the ACR Incidental Findings Committee II on Vascular Findings. J Am Coll Radiol 2013; 10:789-794. Aortic aneurysm NOS (ICD10-I71.9) 7. Cholelithiasis with dilated common bile duct measuring up to 1.4 cm. No CT evidence of acute cholecystitis. If there is clinical concern for acute biliary pathology, recommend further evaluation with MRCP with the patient is clinically stable. An emergent MRCP would likely be degraded by significant motion artifact. 8. Large amount of well-formed stool at the level of the rectum. This may represent fecal impaction. 9. Aortic Atherosclerosis (ICD10-I70.0). Electronically Signed   By: Katherine Mantle M.D.   On: 12/05/2019 21:17    Procedures .Critical Care Performed by: Bill Salinas, PA-C Authorized by: Bill Salinas, PA-C   Critical care  provider statement:    Critical care time (minutes):  35   Critical care was necessary to treat or prevent imminent or life-threatening deterioration of the following conditions:  Dehydration and metabolic crisis   Critical care was time spent personally by  me on the following activities:  Discussions with consultants, evaluation of patient's response to treatment, examination of patient, ordering and performing treatments and interventions, ordering and review of laboratory studies, ordering and review of radiographic studies, pulse oximetry, re-evaluation of patient's condition, obtaining history from patient or surrogate, review of old charts and development of treatment plan with patient or surrogate   (including critical care time)  Medications Ordered in ED Medications  sodium chloride 0.9 % bolus 500 mL (has no administration in time range)  ceFEPIme (MAXIPIME) 2 g in sodium chloride 0.9 % 100 mL IVPB (has no administration in time range)  vancomycin (VANCOCIN) IVPB 1000 mg/200 mL premix (has no administration in time range)  sodium chloride 0.9 % bolus 1,000 mL ( Intravenous Rate/Dose Verify 2019-12-21 2109)    ED Course  I have reviewed the triage vital signs and the nursing notes.  Pertinent labs & imaging results that were available during my care of the patient were reviewed by me and considered in my medical decision making (see chart for details).  Clinical Course as of Dec 02 2333  Wed December 21, 2019  1929 [phone]: No answer   [BM]  1930 [phone]: No answer   [BM]    Clinical Course User Index [BM] Elizabeth Palau   MDM Rules/Calculators/A&P                     86 year old male who lives at home alone presents today after being found on the floor by family members.  Last known well was 2 days ago.  Patient appears somewhat confused on arrival, he is rambling and speaking about his wife who died multiple years ago.  He has diffuse swelling to the right side of  his body including his face which appears consistent with him laying on his side for multiple days.  He has multiple ulcerations and skin breakdown which appears consistent to him laying on his side.  He has no obvious cranial nerve deficits on exam but he does have swelling to the right side of his face which somewhat limits exam.  He has equal grip strength and plantar/dorsiflexion bilaterally.  He is alert to person, place and time he cannot completely recall events of pain to his fall.  Attempted to call family member on listed phone number however there was no answer.  Will obtain trauma labs, portable chest x-ray and portable pelvis.  After this result will pan scan for injury.  Vital signs are stable on arrival no fever, tachycardia, hypotension, tachypnea or hypoxia on room air.  Patient appears stable at this time.  Airway patent, breathing regular and unlabored, circulation intact.  No evidence of long bone fracture or external hemorrhage. Discussed case with Dr. Criss Alvine, do not feel there would be benefit to activate a level 2 trauma at this time however will obtain work-up pertaining to trauma. - CXR:  IMPRESSION:  1. Cardiomegaly without edema or focal pulmonary opacity  2. Chronic elevation of left diaphragm. Fibrosis/scarring suspected  at both bases.   DG Pelvis:  IMPRESSION:  1. Limited evaluation of right greater than left femoral neck due to  positioning and obscuration of the neck by the trochanter.  2. No definite acute osseous abnormality is seen.   CT Head/Cspine/MaxFace/Chest/Abdomen/Pelvis:  IMPRESSION:  1. No acute intracranial abnormality. Advanced atrophy and chronic  microvascular ischemic changes are noted.  2. No acute cervical spine fracture.  3. No acute facial bone fracture, however evaluation was  limited by  significant motion artifact.  4. Multiple lung findings are noted as detailed above with  differential considerations fibrotic lung disease with a    superimposed atypical infectious process such as viral pneumonia not  excluded.  5. There is a masslike airspace opacity in the right upper lobe as  detailed above. This may represent an area of consolidation, however  an underlying mass is not excluded. A three-month follow-up CT of  the chest is recommended.  6. Ectatic abdominal aorta at risk for aneurysm development,  currently measuring 2.8 cm. Recommend followup by ultrasound in 5  years. This recommendation follows ACR consensus guidelines: White  Paper of the ACR Incidental Findings Committee II on Vascular  Findings. J Am Coll Radiol 2013; 10:789-794.  Aortic aneurysm NOS (ICD10-I71.9)  7. Cholelithiasis with dilated common bile duct measuring up to 1.4  cm. No CT evidence of acute cholecystitis. If there is clinical  concern for acute biliary pathology, recommend further evaluation  with MRCP with the patient is clinically stable. An emergent MRCP  would likely be degraded by significant motion artifact.  8. Large amount of well-formed stool at the level of the rectum.  This may represent fecal impaction.  9. Aortic Atherosclerosis (ICD10-I70.0).   DG Right Shoulder:  IMPRESSION:  No acute fracture or dislocation. Narrowed humeral acromial distance  suggesting rotator cuff disease, age indeterminate  - Imaging and lab work reviewed.  Suspect patient's AKI, lactic acidosis and CK is secondary to his multiple days of laying on the ground.  He has been given 1.5 L IV fluid at this time for rehydration.  Will obtain Covid test for possible atypical process within the lungs.  He has no hypoxia, tachycardia, fever or hypotension.  Do not feel that patient necessitates the for a 30 cc/kg bolus at this time, discussed with Dr. Criss AlvineGoldston who agrees.  I consulted with hospitalist Dr. Loney Lohathore who is asked that we start patient on broad-spectrum antibiotics for pneumonia, Maxipime and vancomycin have been ordered.  Dr. Loney Lohathore has accepted  patient to her service.  There were multiple other CT abnormalities seen today which are felt to be incidental including ectatic abdominal aorta measuring 2.8 cm, cholelithiasis, lung mass.  Feel these are incidental, patient has no abdominal pain, do not feel there is any additional work-up in the emergency department indicated regarding these incidental findings.  Patient was reassessed multiple times each time he is resting comfortably and in no acute distress vital signs are stable.  His son is now at bedside, has been updated on findings today, they are agreeable for admission for further work-up.  Leonard Simpson was evaluated in Emergency Department on May 12, 2020 for the symptoms described in the history of present illness. He was evaluated in the context of the global COVID-19 pandemic, which necessitated consideration that the patient might be at risk for infection with the SARS-CoV-2 virus that causes COVID-19. Institutional protocols and algorithms that pertain to the evaluation of patients at risk for COVID-19 are in a state of rapid change based on information released by regulatory bodies including the CDC and federal and state organizations. These policies and algorithms were followed during the patient's care in the ED.   Note: Portions of this report may have been transcribed using voice recognition software. Every effort was made to ensure accuracy; however, inadvertent computerized transcription errors may still be present. Final Clinical Impression(s) / ED Diagnoses Final diagnoses:  Traumatic rhabdomyolysis, initial encounter [facility](HCC)  Fall, initial encounter  Lactic  acidosis    Rx / DC Orders ED Discharge Orders    None       Gari Crown 11/23/2019 2338    Sherwood Gambler, MD 12/04/19 1630

## 2019-12-03 NOTE — ED Notes (Signed)
Pt's son is at bedside and st's pt's mental statue is pt's norm.

## 2019-12-03 NOTE — Progress Notes (Signed)
Pharmacy Antibiotic Note  Leonard Simpson is a 86 y.o. male admitted on 11/23/2019 with pneumonia.  Pharmacy has been consulted for Vancomycin dosing.  Height: 5\' 6"  (167.6 cm) Weight: 130 lb (59 kg) IBW/kg (Calculated) : 63.8  Temp (24hrs), Avg:97.9 F (36.6 C), Min:97.5 F (36.4 C), Max:98.2 F (36.8 C)  Recent Labs  Lab 11/29/2019 1905 11/15/2019 1955 11/23/2019 2011  WBC  --  20.6*  --   CREATININE 2.10* 1.98*  --   LATICACIDVEN  --   --  4.5*    Estimated Creatinine Clearance: 19.5 mL/min (A) (by C-G formula based on SCr of 1.98 mg/dL (H)).    No Known Allergies  Antimicrobials this admission: 2/24 Cefepime >>  2/24 Vancomycin >>   Dose adjustments this admission:   Microbiology results: Pending   Plan:  - Vancomycin 1000mg  IV q48h  - Est Calc AUC 538 - Monitor patients renal function and urine output   Thank you for allowing pharmacy to be a part of this patient's care.  3/24 PharmD. BCPS  11/30/2019 10:34 PM

## 2019-12-03 NOTE — ED Notes (Signed)
Date and time results received: 19-Dec-2019  (use smartphrase ".now" to insert current time)  Test: Lactic Critical Value: 4.5 Name of Provider Notified: Lawrence Marseilles Orders Received? Or Actions Taken?:

## 2019-12-04 ENCOUNTER — Inpatient Hospital Stay (HOSPITAL_COMMUNITY): Payer: Medicare Other

## 2019-12-04 ENCOUNTER — Other Ambulatory Visit (HOSPITAL_COMMUNITY): Payer: Medicare Other

## 2019-12-04 DIAGNOSIS — G9341 Metabolic encephalopathy: Secondary | ICD-10-CM | POA: Diagnosis present

## 2019-12-04 DIAGNOSIS — J189 Pneumonia, unspecified organism: Secondary | ICD-10-CM | POA: Diagnosis present

## 2019-12-04 DIAGNOSIS — R7989 Other specified abnormal findings of blood chemistry: Secondary | ICD-10-CM | POA: Diagnosis present

## 2019-12-04 DIAGNOSIS — R778 Other specified abnormalities of plasma proteins: Secondary | ICD-10-CM | POA: Diagnosis present

## 2019-12-04 DIAGNOSIS — N400 Enlarged prostate without lower urinary tract symptoms: Secondary | ICD-10-CM | POA: Diagnosis present

## 2019-12-04 DIAGNOSIS — W19XXXA Unspecified fall, initial encounter: Secondary | ICD-10-CM | POA: Diagnosis present

## 2019-12-04 DIAGNOSIS — I350 Nonrheumatic aortic (valve) stenosis: Secondary | ICD-10-CM

## 2019-12-04 DIAGNOSIS — I340 Nonrheumatic mitral (valve) insufficiency: Secondary | ICD-10-CM

## 2019-12-04 DIAGNOSIS — N179 Acute kidney failure, unspecified: Secondary | ICD-10-CM | POA: Diagnosis present

## 2019-12-04 DIAGNOSIS — M6282 Rhabdomyolysis: Secondary | ICD-10-CM | POA: Diagnosis present

## 2019-12-04 LAB — VITAMIN B12: Vitamin B-12: 400 pg/mL (ref 180–914)

## 2019-12-04 LAB — COMPREHENSIVE METABOLIC PANEL
ALT: 40 U/L (ref 0–44)
AST: 115 U/L — ABNORMAL HIGH (ref 15–41)
Albumin: 2.4 g/dL — ABNORMAL LOW (ref 3.5–5.0)
Alkaline Phosphatase: 43 U/L (ref 38–126)
Anion gap: 10 (ref 5–15)
BUN: 45 mg/dL — ABNORMAL HIGH (ref 8–23)
CO2: 22 mmol/L (ref 22–32)
Calcium: 7.7 mg/dL — ABNORMAL LOW (ref 8.9–10.3)
Chloride: 110 mmol/L (ref 98–111)
Creatinine, Ser: 1.92 mg/dL — ABNORMAL HIGH (ref 0.61–1.24)
GFR calc Af Amer: 34 mL/min — ABNORMAL LOW (ref >60)
GFR calc non Af Amer: 29 mL/min — ABNORMAL LOW (ref >60)
Glucose, Bld: 119 mg/dL — ABNORMAL HIGH (ref 70–99)
Potassium: 3.9 mmol/L (ref 3.5–5.1)
Sodium: 142 mmol/L (ref 135–145)
Total Bilirubin: 0.9 mg/dL (ref 0.3–1.2)
Total Protein: 5.4 g/dL — ABNORMAL LOW (ref 6.5–8.1)

## 2019-12-04 LAB — ECHOCARDIOGRAM COMPLETE
Height: 66 in
Weight: 2080 oz

## 2019-12-04 LAB — PROCALCITONIN: Procalcitonin: 0.61 ng/mL

## 2019-12-04 LAB — TROPONIN I (HIGH SENSITIVITY)
Troponin I (High Sensitivity): 9690 ng/L (ref <18)
Troponin I (High Sensitivity): 11577 ng/L (ref <18)
Troponin I (High Sensitivity): 7079 ng/L (ref <18)

## 2019-12-04 LAB — SARS CORONAVIRUS 2 (TAT 6-24 HRS): SARS Coronavirus 2: NEGATIVE

## 2019-12-04 LAB — CBC
HCT: 35.6 % — ABNORMAL LOW (ref 39.0–52.0)
Hemoglobin: 11.6 g/dL — ABNORMAL LOW (ref 13.0–17.0)
MCH: 32 pg (ref 26.0–34.0)
MCHC: 32.6 g/dL (ref 30.0–36.0)
MCV: 98.1 fL (ref 80.0–100.0)
Platelets: 112 10*3/uL — ABNORMAL LOW (ref 150–400)
RBC: 3.63 MIL/uL — ABNORMAL LOW (ref 4.22–5.81)
RDW: 14.9 % (ref 11.5–15.5)
WBC: 15.7 10*3/uL — ABNORMAL HIGH (ref 4.0–10.5)
nRBC: 0 % (ref 0.0–0.2)

## 2019-12-04 LAB — AMMONIA: Ammonia: 32 umol/L (ref 9–35)

## 2019-12-04 LAB — MAGNESIUM: Magnesium: 1.9 mg/dL (ref 1.7–2.4)

## 2019-12-04 LAB — TSH: TSH: 4.041 u[IU]/mL (ref 0.350–4.500)

## 2019-12-04 LAB — CK: Total CK: 2231 U/L — ABNORMAL HIGH (ref 49–397)

## 2019-12-04 MED ORDER — POLYETHYLENE GLYCOL 3350 17 G PO PACK
17.0000 g | PACK | Freq: Every day | ORAL | Status: DC
  Administered 2019-12-04: 17 g via ORAL
  Filled 2019-12-04 (×3): qty 1

## 2019-12-04 MED ORDER — SODIUM CHLORIDE 0.9 % IV SOLN
2.0000 g | INTRAVENOUS | Status: DC
  Administered 2019-12-04 – 2019-12-08 (×5): 2 g via INTRAVENOUS
  Filled 2019-12-04 (×6): qty 2

## 2019-12-04 MED ORDER — HEPARIN SODIUM (PORCINE) 5000 UNIT/ML IJ SOLN
5000.0000 [IU] | Freq: Three times a day (TID) | INTRAMUSCULAR | Status: DC
  Administered 2019-12-04 – 2019-12-09 (×13): 5000 [IU] via SUBCUTANEOUS
  Filled 2019-12-04 (×14): qty 1

## 2019-12-04 MED ORDER — ACETAMINOPHEN 325 MG PO TABS
650.0000 mg | ORAL_TABLET | Freq: Four times a day (QID) | ORAL | Status: DC | PRN
  Filled 2019-12-04: qty 2

## 2019-12-04 MED ORDER — LACTATED RINGERS IV SOLN: INTRAVENOUS | Status: DC

## 2019-12-04 MED ORDER — ACETAMINOPHEN 650 MG RE SUPP: 650.0000 mg | Freq: Four times a day (QID) | RECTAL | Status: DC | PRN

## 2019-12-04 MED ORDER — PANTOPRAZOLE SODIUM 20 MG PO TBEC
20.0000 mg | DELAYED_RELEASE_TABLET | Freq: Every day | ORAL | Status: DC
  Administered 2019-12-04: 20 mg via ORAL
  Filled 2019-12-04 (×4): qty 1

## 2019-12-04 MED ORDER — SODIUM CHLORIDE 0.9 % IV SOLN: INTRAVENOUS | Status: AC

## 2019-12-04 MED ORDER — NITROGLYCERIN 0.4 MG SL SUBL: 0.4000 mg | SUBLINGUAL_TABLET | SUBLINGUAL | Status: DC | PRN

## 2019-12-04 MED ORDER — LORAZEPAM 2 MG/ML IJ SOLN
2.0000 mg | Freq: Once | INTRAMUSCULAR | Status: AC
  Administered 2019-12-04: 2 mg via INTRAVENOUS
  Filled 2019-12-04: qty 1

## 2019-12-04 MED ORDER — CHLORHEXIDINE GLUCONATE CLOTH 2 % EX PADS
6.0000 | MEDICATED_PAD | Freq: Every day | CUTANEOUS | Status: DC
  Administered 2019-12-04 – 2019-12-09 (×6): 6 via TOPICAL

## 2019-12-04 MED ORDER — TAMSULOSIN HCL 0.4 MG PO CAPS
0.4000 mg | ORAL_CAPSULE | Freq: Every day | ORAL | Status: DC
  Administered 2019-12-04: 0.4 mg via ORAL
  Filled 2019-12-04 (×4): qty 1

## 2019-12-04 MED ORDER — BISACODYL 10 MG RE SUPP
10.0000 mg | Freq: Once | RECTAL | Status: AC
  Administered 2019-12-07: 06:00:00 10 mg via RECTAL
  Filled 2019-12-04: qty 1

## 2019-12-04 NOTE — Progress Notes (Signed)
PROGRESS NOTE    Leonard Simpson  OZD:[id number] DOB: 1923/12/26 DOA: 11/22/2019 PCP: [facility]     Brief Narrative:  Patient is a 86 year old male with a medical history significant for CAD, hypertension, hyperlipidemia, BPH, GERD, and what sounds like dementia who presents for altered mental status and a fall on 11/17/2019.  Patient was found by son on the floor and noted to have multiple sores all over his body.  He was confused/disoriented on presentation.  His son helps him with ADLs.  He was at his baseline 2 days prior to admission when he was last seen.  CT chest concerning for possible pneumonia, Covid testing negative.  Treatment for rhabdomyolysis with fluids was initiated in addition to vancomycin and cefepime for pneumonia.   New events last 24 hours / Subjective: Patient reports right shoulder pain and shows me a bruise.  He is working with occupational therapy.  I did speak with his son Decker who notes the patient is usually oriented to person and self only.  He usually does not know the date or who the president is.  Was deafly not at baseline yesterday.  Troponins noted to be elevated, cardiology notified who thought it was more related to rhabdomyolysis and kidney injury.  EKG nonconcerning.  Assessment & Plan:   Principal Problem:   Rhabdomyolysis  LR 125 mL/hr  Monitor CK, renal function  Improving  Hold statin  Active Problems:   Sepsis (HCC)  WBC, RR, HR  Improved today  2/2 PNA?  Blood cx's pending    Pneumonia  Cont Vanc and Cefepime  On RA  Would explain WBC    AKI (acute kidney injury) (HCC)  Fluids as above  Monitor BMP  Improving  Hold lisinopril     BPH  Flomax 0.4 mg daily    Coronary artery disease  Sublingual nitro as needed     Hypertension  Hold meds    Hypercholesterolemia  Hold statin    GERD (gastroesophageal reflux disease)  Protonix 20 mg/d    Acute metabolic encephalopathy  1:1 sitter  I do not think  he is that far from his baseline    Elevated LFTs  Monitor CMP, secondary to principal problem   Fall  Appreciate PT/OT    Elevated troponin I level  Cardiology reviewed case, did not feel it was related to ACS  DVT prophylaxis: Heparin Code Status: DNR Family Communication: Son, Leonard Simpson who I spoke with today Coming From: Home Disposition Plan: Pending Barriers to Discharge: Clinical improvement; PT/OT working with patient  Antimicrobials:  Anti-infectives (From admission, onward)   Start     Dose/Rate Route Frequency Ordered Stop   12/04/19 2200  ceFEPIme (MAXIPIME) 2 g in sodium chloride 0.9 % 100 mL IVPB     2 g 200 mL/hr over 30 Minutes Intravenous Every 24 hours 12/04/19 0048     12/07/2019 2245  vancomycin (VANCOCIN) IVPB 1000 mg/200 mL premix     1,000 mg 200 mL/hr over 60 Minutes Intravenous Every 48 hours 11/27/2019 2230     12/04/2019 2215  ceFEPIme (MAXIPIME) 2 g in sodium chloride 0.9 % 100 mL IVPB     2 g 200 mL/hr over 30 Minutes Intravenous  Once 11/22/2019 2207 12/04/19 0258       Objective: Vitals:   12/04/19 0230 12/04/19 0400 12/04/19 0806 12/04/19 1244  BP: (!) 121/55 115/60 120/84 139/77  Pulse: 84 78 78 74  Resp: 17 20 18 18   Temp: 99.6 F (37.6  C) 98.9 F (37.2 C) 98.6 F (37 C) 97.8 F (36.6 C)  TempSrc: Oral Axillary Oral Oral  SpO2: 94% 94% 97% 95%  Weight:      Height:        Intake/Output Summary (Last 24 hours) at 12/04/2019 1255 Last data filed at 12/04/2019 0500 Gross per 24 hour  Intake 2453.33 ml  Output 200 ml  Net 2253.33 ml   Filed Weights   December 24, 2019 1850  Weight: 59 kg    Examination:  General exam: Appears relatively comfortable  Respiratory system: Clear to auscultation. Respiratory effort normal. No respiratory distress. No conversational dyspnea.  Cardiovascular system: S1 & S2 heard, RRR. No pedal edema. Gastrointestinal system: Abdomen is nondistended, soft and nontender. Normal bowel sounds heard. Central nervous  system: Alert to person; thinks he is home; does not know president or date (does not know at baseline) Extremities: various scrapes and areas of ecchymosis; +bruise on R shoulder Psychiatry: Judgement and insight appear limited. Mood & affect appropriate.   Data Reviewed: I have personally reviewed following labs and imaging studies  CBC: Recent Labs  Lab 12/24/19 1905 12/24/2019 1955 12/04/19 0353  WBC  --  20.6* 15.7*  HGB 15.6 13.1 11.6*  HCT 46.0 42.4 35.6*  MCV  --  102.4* 98.1  PLT  --  108* 112*   Basic Metabolic Panel: Recent Labs  Lab 12/24/19 1905 2019/12/24 1955 12/04/19 0103 12/04/19 0353  NA 141 141  --  142  K 5.1 5.9*  --  3.9  CL 110 109  --  110  CO2  --  21*  --  22  GLUCOSE 91 89  --  119*  BUN 61* 42*  --  45*  CREATININE 2.10* 1.98*  --  1.92*  CALCIUM  --  8.3*  --  7.7*  MG  --   --  1.9  --    GFR: Estimated Creatinine Clearance: 20.1 mL/min (A) (by C-G formula based on SCr of 1.92 mg/dL (H)).   Liver Function Tests: Recent Labs  Lab 12/24/19 1955 12/04/19 0353  AST 152* 115*  ALT 51* 40  ALKPHOS 52 43  BILITOT 1.7* 0.9  PROT 5.9* 5.4*  ALBUMIN 2.8* 2.4*   Recent Labs  Lab 12/04/19 0106  AMMONIA 32   Coagulation Profile: Recent Labs  Lab 12/24/2019 2011  INR 1.1   Cardiac Enzymes: Recent Labs  Lab 12/24/19 1955 12/04/19 0353  CKTOTAL 3,222* 2,231*   CBG: Recent Labs  Lab 12/24/2019 1846  GLUCAP 83   Thyroid Function Tests: Recent Labs    12/04/19 0106  TSH 4.041   Anemia Panel: Recent Labs    12/04/19 0106  VITAMINB12 400   Sepsis Labs: Recent Labs  Lab 24-Dec-2019 2011 Dec 24, 2019 2242 12/04/19 0353  PROCALCITON  --   --  0.61  LATICACIDVEN 4.5* 1.7  --     Recent Results (from the past 240 hour(s))  SARS CORONAVIRUS 2 (TAT 6-24 HRS) Nasopharyngeal Nasopharyngeal Swab     Status: None   Collection Time: 12/04/19  1:00 AM   Specimen: Nasopharyngeal Swab  Result Value Ref Range Status   SARS Coronavirus 2  NEGATIVE NEGATIVE Final    Comment: (NOTE) SARS-CoV-2 target nucleic acids are NOT DETECTED. The SARS-CoV-2 RNA is generally detectable in upper and lower respiratory specimens during the acute phase of infection. Negative results do not preclude SARS-CoV-2 infection, do not rule out co-infections with other pathogens, and should not be used as the sole  basis for treatment or other patient management decisions. Negative results must be combined with clinical observations, patient history, and epidemiological information. The expected result is Negative. Fact Sheet for Patients: HairSlick.nohttps://www.fda.gov/media/138098/download Fact Sheet for Healthcare Providers: quierodirigir.comhttps://www.fda.gov/media/138095/download This test is not yet approved or cleared by the [place] FDA and  has been authorized for detection and/or diagnosis of SARS-CoV-2 by FDA under an Emergency Use Authorization (EUA). This EUA will remain  in effect (meaning this test can be used) for the duration of the COVID-19 declaration under Section 56 4(b)(1) of the Act, 21 U.S.C. section 360bbb-3(b)(1), unless the authorization is terminated or revoked sooner. Performed at [facility], [address] [place], [place] [address]       Radiology Studies: CT ABDOMEN PELVIS WO CONTRAST  Result Date: 01-26-2020 CLINICAL DATA:  Trauma. EXAM: CT HEAD WITHOUT CONTRAST CT MAXILLOFACIAL WITHOUT CONTRAST CT CERVICAL SPINE WITHOUT CONTRAST CT CHEST, ABDOMEN AND PELVIS WITHOUT CONTRAST TECHNIQUE: Contiguous axial images were obtained from the base of the skull through the vertex without intravenous contrast. Multidetector CT imaging of the maxillofacial structures was performed. Multiplanar CT image reconstructions were also generated. A small metallic BB was placed on the right temple in order to reliably differentiate right from left. Multidetector CT imaging of the cervical spine was performed without intravenous contrast. Multiplanar  CT image reconstructions were also generated. Multidetector CT imaging of the chest, abdomen and pelvis was performed following the standard protocol without IV contrast. COMPARISON:  None. FINDINGS: CT HEAD FINDINGS Brain: No evidence of acute infarction, hemorrhage, hydrocephalus, extra-axial collection or mass lesion/mass effect. There is extensive volume loss in addition to extensive chronic microvascular ischemic changes. 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 Vascular: No hyperdense vessel or unexpected calcification. Skull: Normal. Negative for fracture or focal lesion. Other: None. CT MAXILLOFACIAL FINDINGS Osseous: The evaluation of the osseous structures is significantly limited by motion artifact. Given this limitation, there is no definite acute osseous abnormality. Orbits: Negative. No traumatic or inflammatory finding. Sinuses: Clear. Soft tissues: Negative. CT CERVICAL SPINE FINDINGS Alignment: Normal. Skull base and vertebrae: No acute fracture. No primary bone lesion or focal pathologic process. Soft tissues and spinal canal: No prevertebral fluid or swelling. No visible canal hematoma. Disc levels: Multilevel degenerative changes are noted throughout the cervical spine, greatest at the C5-C6 and C6-C7 levels. Other: None. CT CHEST FINDINGS Cardiovascular: Heart size is normal. Advanced atherosclerotic changes are noted of the thoracic aorta and coronary arteries. There is no significant pericardial effusion. Mediastinum/Nodes: --No mediastinal or hilar lymphadenopathy. --No axillary lymphadenopathy. --No supraclavicular lymphadenopathy. --Normal thyroid gland. --The esophagus is unremarkable Lungs/Pleura: There are areas of honeycombing involving the lung bases and lung apices. There are coarse reticular lung markings bilaterally. There is a somewhat masslike airspace opacity involving the right upper lobe measuring approximately 1.8 x 2.2 cm (axial series 5, image 57). The  trachea is unremarkable. There is no pneumothorax or significant pleural effusion. The left hemidiaphragm is elevated. Musculoskeletal: No chest wall abnormality. No acute or significant osseous findings. CT ABDOMEN AND PELVIS FINDINGS Hepatobiliary: The liver is normal. Cholelithiasis without acute inflammation.The common bile duct is dilated measuring up to approximately 1.4 cm. Pancreas: Normal contours without ductal dilatation. No peripancreatic fluid collection. Spleen: No splenic laceration or hematoma. Adrenals/Urinary Tract: --Adrenal glands: No adrenal hemorrhage. --Right kidney/ureter: No hydronephrosis or perinephric hematoma. --Left kidney/ureter: No hydronephrosis or perinephric hematoma. --Urinary bladder: The bladder is decompressed with a Foley catheter. Stomach/Bowel: --Stomach/Duodenum: No hiatal hernia  or other gastric abnormality. Normal duodenal course and caliber. --Small bowel: No dilatation or inflammation. --Colon: There is a large amount of well-formed stool at the level of the rectum. There is a moderate amount of stool throughout the remaining portions of the colon. --Appendix: Not visualized. No right lower quadrant inflammation or free fluid. Vascular/Lymphatic: Advanced vascular calcifications are noted. The infrarenal abdominal aorta measures up to approximately 2.8 cm in diameter. --No retroperitoneal lymphadenopathy. --No mesenteric lymphadenopathy. --No pelvic or inguinal lymphadenopathy. Reproductive: The prostate gland is enlarged. Other: No ascites or free air. There is mild body wall edema. Musculoskeletal. No acute displaced fractures. IMPRESSION: 1. No acute intracranial abnormality. Advanced atrophy and chronic microvascular ischemic changes are noted. 2. No acute cervical spine fracture. 3. No acute facial bone fracture, however evaluation was limited by significant motion artifact. 4. Multiple lung findings are noted as detailed above with differential considerations  fibrotic lung disease with a superimposed atypical infectious process such as viral pneumonia not excluded. 5. There is a masslike airspace opacity in the right upper lobe as detailed above. This may represent an area of consolidation, however an underlying mass is not excluded. A three-month follow-up CT of the chest is recommended. 6. Ectatic abdominal aorta at risk for aneurysm development, currently measuring 2.8 cm. Recommend followup by ultrasound in 5 years. This recommendation follows ACR consensus guidelines: White Paper of the ACR Incidental Findings Committee II on Vascular Findings. J Am Coll Radiol 2013; 10:789-794. Aortic aneurysm NOS (ICD10-I71.9) 7. Cholelithiasis with dilated common bile duct measuring up to 1.4 cm. No CT evidence of acute cholecystitis. If there is clinical concern for acute biliary pathology, recommend further evaluation with MRCP with the patient is clinically stable. An emergent MRCP would likely be degraded by significant motion artifact. 8. Large amount of well-formed stool at the level of the rectum. This may represent fecal impaction. 9. Aortic Atherosclerosis (ICD10-I70.0). Electronically Signed   By: Katherine Mantle M.D.   On: Dec 21, 2019 21:17   CT Head Wo Contrast  Result Date: 12/21/19 CLINICAL DATA:  Trauma. EXAM: CT HEAD WITHOUT CONTRAST CT MAXILLOFACIAL WITHOUT CONTRAST CT CERVICAL SPINE WITHOUT CONTRAST CT CHEST, ABDOMEN AND PELVIS WITHOUT CONTRAST TECHNIQUE: Contiguous axial images were obtained from the base of the skull through the vertex without intravenous contrast. Multidetector CT imaging of the maxillofacial structures was performed. Multiplanar CT image reconstructions were also generated. A small metallic BB was placed on the right temple in order to reliably differentiate right from left. Multidetector CT imaging of the cervical spine was performed without intravenous contrast. Multiplanar CT image reconstructions were also generated.  Multidetector CT imaging of the chest, abdomen and pelvis was performed following the standard protocol without IV contrast. COMPARISON:  None. FINDINGS: CT HEAD FINDINGS Brain: No evidence of acute infarction, hemorrhage, hydrocephalus, extra-axial collection or mass lesion/mass effect. There is extensive volume loss in addition to extensive chronic microvascular ischemic changes. 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 Vascular: No hyperdense vessel or unexpected calcification. Skull: Normal. Negative for fracture or focal lesion. Other: None. CT MAXILLOFACIAL FINDINGS Osseous: The evaluation of the osseous structures is significantly limited by motion artifact. Given this limitation, there is no definite acute osseous abnormality. Orbits: Negative. No traumatic or inflammatory finding. Sinuses: Clear. Soft tissues: Negative. CT CERVICAL SPINE FINDINGS Alignment: Normal. Skull base and vertebrae: No acute fracture. No primary bone lesion or focal pathologic process. Soft tissues and spinal canal: No prevertebral fluid or swelling. No visible canal hematoma. Disc levels:  Multilevel degenerative changes are noted throughout the cervical spine, greatest at the C5-C6 and C6-C7 levels. Other: None. CT CHEST FINDINGS Cardiovascular: Heart size is normal. Advanced atherosclerotic changes are noted of the thoracic aorta and coronary arteries. There is no significant pericardial effusion. Mediastinum/Nodes: --No mediastinal or hilar lymphadenopathy. --No axillary lymphadenopathy. --No supraclavicular lymphadenopathy. --Normal thyroid gland. --The esophagus is unremarkable Lungs/Pleura: There are areas of honeycombing involving the lung bases and lung apices. There are coarse reticular lung markings bilaterally. There is a somewhat masslike airspace opacity involving the right upper lobe measuring approximately 1.8 x 2.2 cm (axial series 5, image 57). The trachea is unremarkable. There is no pneumothorax  or significant pleural effusion. The left hemidiaphragm is elevated. Musculoskeletal: No chest wall abnormality. No acute or significant osseous findings. CT ABDOMEN AND PELVIS FINDINGS Hepatobiliary: The liver is normal. Cholelithiasis without acute inflammation.The common bile duct is dilated measuring up to approximately 1.4 cm. Pancreas: Normal contours without ductal dilatation. No peripancreatic fluid collection. Spleen: No splenic laceration or hematoma. Adrenals/Urinary Tract: --Adrenal glands: No adrenal hemorrhage. --Right kidney/ureter: No hydronephrosis or perinephric hematoma. --Left kidney/ureter: No hydronephrosis or perinephric hematoma. --Urinary bladder: The bladder is decompressed with a Foley catheter. Stomach/Bowel: --Stomach/Duodenum: No hiatal hernia or other gastric abnormality. Normal duodenal course and caliber. --Small bowel: No dilatation or inflammation. --Colon: There is a large amount of well-formed stool at the level of the rectum. There is a moderate amount of stool throughout the remaining portions of the colon. --Appendix: Not visualized. No right lower quadrant inflammation or free fluid. Vascular/Lymphatic: Advanced vascular calcifications are noted. The infrarenal abdominal aorta measures up to approximately 2.8 cm in diameter. --No retroperitoneal lymphadenopathy. --No mesenteric lymphadenopathy. --No pelvic or inguinal lymphadenopathy. Reproductive: The prostate gland is enlarged. Other: No ascites or free air. There is mild body wall edema. Musculoskeletal. No acute displaced fractures. IMPRESSION: 1. No acute intracranial abnormality. Advanced atrophy and chronic microvascular ischemic changes are noted. 2. No acute cervical spine fracture. 3. No acute facial bone fracture, however evaluation was limited by significant motion artifact. 4. Multiple lung findings are noted as detailed above with differential considerations fibrotic lung disease with a superimposed atypical  infectious process such as viral pneumonia not excluded. 5. There is a masslike airspace opacity in the right upper lobe as detailed above. This may represent an area of consolidation, however an underlying mass is not excluded. A three-month follow-up CT of the chest is recommended. 6. Ectatic abdominal aorta at risk for aneurysm development, currently measuring 2.8 cm. Recommend followup by ultrasound in 5 years. This recommendation follows ACR consensus guidelines: White Paper of the ACR Incidental Findings Committee II on Vascular Findings. J Am Coll Radiol 2013; 10:789-794. Aortic aneurysm NOS (ICD10-I71.9) 7. Cholelithiasis with dilated common bile duct measuring up to 1.4 cm. No CT evidence of acute cholecystitis. If there is clinical concern for acute biliary pathology, recommend further evaluation with MRCP with the patient is clinically stable. An emergent MRCP would likely be degraded by significant motion artifact. 8. Large amount of well-formed stool at the level of the rectum. This may represent fecal impaction. 9. Aortic Atherosclerosis (ICD10-I70.0). Electronically Signed   By: Katherine Mantle M.D.   On: Dec 23, 2019 21:17   CT CHEST WO CONTRAST  Result Date: 12/23/2019 CLINICAL DATA:  Trauma. EXAM: CT HEAD WITHOUT CONTRAST CT MAXILLOFACIAL WITHOUT CONTRAST CT CERVICAL SPINE WITHOUT CONTRAST CT CHEST, ABDOMEN AND PELVIS WITHOUT CONTRAST TECHNIQUE: Contiguous axial images were obtained from the base of the skull through  the vertex without intravenous contrast. Multidetector CT imaging of the maxillofacial structures was performed. Multiplanar CT image reconstructions were also generated. A small metallic BB was placed on the right temple in order to reliably differentiate right from left. Multidetector CT imaging of the cervical spine was performed without intravenous contrast. Multiplanar CT image reconstructions were also generated. Multidetector CT imaging of the chest, abdomen and pelvis was  performed following the standard protocol without IV contrast. COMPARISON:  None. FINDINGS: CT HEAD FINDINGS Brain: No evidence of acute infarction, hemorrhage, hydrocephalus, extra-axial collection or mass lesion/mass effect. There is extensive volume loss in addition to extensive chronic microvascular ischemic changes. 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 Vascular: No hyperdense vessel or unexpected calcification. Skull: Normal. Negative for fracture or focal lesion. Other: None. CT MAXILLOFACIAL FINDINGS Osseous: The evaluation of the osseous structures is significantly limited by motion artifact. Given this limitation, there is no definite acute osseous abnormality. Orbits: Negative. No traumatic or inflammatory finding. Sinuses: Clear. Soft tissues: Negative. CT CERVICAL SPINE FINDINGS Alignment: Normal. Skull base and vertebrae: No acute fracture. No primary bone lesion or focal pathologic process. Soft tissues and spinal canal: No prevertebral fluid or swelling. No visible canal hematoma. Disc levels: Multilevel degenerative changes are noted throughout the cervical spine, greatest at the C5-C6 and C6-C7 levels. Other: None. CT CHEST FINDINGS Cardiovascular: Heart size is normal. Advanced atherosclerotic changes are noted of the thoracic aorta and coronary arteries. There is no significant pericardial effusion. Mediastinum/Nodes: --No mediastinal or hilar lymphadenopathy. --No axillary lymphadenopathy. --No supraclavicular lymphadenopathy. --Normal thyroid gland. --The esophagus is unremarkable Lungs/Pleura: There are areas of honeycombing involving the lung bases and lung apices. There are coarse reticular lung markings bilaterally. There is a somewhat masslike airspace opacity involving the right upper lobe measuring approximately 1.8 x 2.2 cm (axial series 5, image 57). The trachea is unremarkable. There is no pneumothorax or significant pleural effusion. The left hemidiaphragm is  elevated. Musculoskeletal: No chest wall abnormality. No acute or significant osseous findings. CT ABDOMEN AND PELVIS FINDINGS Hepatobiliary: The liver is normal. Cholelithiasis without acute inflammation.The common bile duct is dilated measuring up to approximately 1.4 cm. Pancreas: Normal contours without ductal dilatation. No peripancreatic fluid collection. Spleen: No splenic laceration or hematoma. Adrenals/Urinary Tract: --Adrenal glands: No adrenal hemorrhage. --Right kidney/ureter: No hydronephrosis or perinephric hematoma. --Left kidney/ureter: No hydronephrosis or perinephric hematoma. --Urinary bladder: The bladder is decompressed with a Foley catheter. Stomach/Bowel: --Stomach/Duodenum: No hiatal hernia or other gastric abnormality. Normal duodenal course and caliber. --Small bowel: No dilatation or inflammation. --Colon: There is a large amount of well-formed stool at the level of the rectum. There is a moderate amount of stool throughout the remaining portions of the colon. --Appendix: Not visualized. No right lower quadrant inflammation or free fluid. Vascular/Lymphatic: Advanced vascular calcifications are noted. The infrarenal abdominal aorta measures up to approximately 2.8 cm in diameter. --No retroperitoneal lymphadenopathy. --No mesenteric lymphadenopathy. --No pelvic or inguinal lymphadenopathy. Reproductive: The prostate gland is enlarged. Other: No ascites or free air. There is mild body wall edema. Musculoskeletal. No acute displaced fractures. IMPRESSION: 1. No acute intracranial abnormality. Advanced atrophy and chronic microvascular ischemic changes are noted. 2. No acute cervical spine fracture. 3. No acute facial bone fracture, however evaluation was limited by significant motion artifact. 4. Multiple lung findings are noted as detailed above with differential considerations fibrotic lung disease with a superimposed atypical infectious process such as viral pneumonia not excluded. 5.  There is a masslike airspace  opacity in the right upper lobe as detailed above. This may represent an area of consolidation, however an underlying mass is not excluded. A three-month follow-up CT of the chest is recommended. 6. Ectatic abdominal aorta at risk for aneurysm development, currently measuring 2.8 cm. Recommend followup by ultrasound in 5 years. This recommendation follows ACR consensus guidelines: White Paper of the ACR Incidental Findings Committee II on Vascular Findings. J Am Coll Radiol 2013; 10:789-794. Aortic aneurysm NOS (ICD10-I71.9) 7. Cholelithiasis with dilated common bile duct measuring up to 1.4 cm. No CT evidence of acute cholecystitis. If there is clinical concern for acute biliary pathology, recommend further evaluation with MRCP with the patient is clinically stable. An emergent MRCP would likely be degraded by significant motion artifact. 8. Large amount of well-formed stool at the level of the rectum. This may represent fecal impaction. 9. Aortic Atherosclerosis (ICD10-I70.0). Electronically Signed   By: Katherine Mantle M.D.   On: 11/17/2019 21:17   CT Cervical Spine Wo Contrast  Result Date: 12/01/2019 CLINICAL DATA:  Trauma. EXAM: CT HEAD WITHOUT CONTRAST CT MAXILLOFACIAL WITHOUT CONTRAST CT CERVICAL SPINE WITHOUT CONTRAST CT CHEST, ABDOMEN AND PELVIS WITHOUT CONTRAST TECHNIQUE: Contiguous axial images were obtained from the base of the skull through the vertex without intravenous contrast. Multidetector CT imaging of the maxillofacial structures was performed. Multiplanar CT image reconstructions were also generated. A small metallic BB was placed on the right temple in order to reliably differentiate right from left. Multidetector CT imaging of the cervical spine was performed without intravenous contrast. Multiplanar CT image reconstructions were also generated. Multidetector CT imaging of the chest, abdomen and pelvis was performed following the standard protocol without  IV contrast. COMPARISON:  None. FINDINGS: CT HEAD FINDINGS Brain: No evidence of acute infarction, hemorrhage, hydrocephalus, extra-axial collection or mass lesion/mass effect. There is extensive volume loss in addition to extensive chronic microvascular ischemic changes. 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 Vascular: No hyperdense vessel or unexpected calcification. Skull: Normal. Negative for fracture or focal lesion. Other: None. CT MAXILLOFACIAL FINDINGS Osseous: The evaluation of the osseous structures is significantly limited by motion artifact. Given this limitation, there is no definite acute osseous abnormality. Orbits: Negative. No traumatic or inflammatory finding. Sinuses: Clear. Soft tissues: Negative. CT CERVICAL SPINE FINDINGS Alignment: Normal. Skull base and vertebrae: No acute fracture. No primary bone lesion or focal pathologic process. Soft tissues and spinal canal: No prevertebral fluid or swelling. No visible canal hematoma. Disc levels: Multilevel degenerative changes are noted throughout the cervical spine, greatest at the C5-C6 and C6-C7 levels. Other: None. CT CHEST FINDINGS Cardiovascular: Heart size is normal. Advanced atherosclerotic changes are noted of the thoracic aorta and coronary arteries. There is no significant pericardial effusion. Mediastinum/Nodes: --No mediastinal or hilar lymphadenopathy. --No axillary lymphadenopathy. --No supraclavicular lymphadenopathy. --Normal thyroid gland. --The esophagus is unremarkable Lungs/Pleura: There are areas of honeycombing involving the lung bases and lung apices. There are coarse reticular lung markings bilaterally. There is a somewhat masslike airspace opacity involving the right upper lobe measuring approximately 1.8 x 2.2 cm (axial series 5, image 57). The trachea is unremarkable. There is no pneumothorax or significant pleural effusion. The left hemidiaphragm is elevated. Musculoskeletal: No chest wall abnormality.  No acute or significant osseous findings. CT ABDOMEN AND PELVIS FINDINGS Hepatobiliary: The liver is normal. Cholelithiasis without acute inflammation.The common bile duct is dilated measuring up to approximately 1.4 cm. Pancreas: Normal contours without ductal dilatation. No peripancreatic fluid collection. Spleen: No splenic laceration or  hematoma. Adrenals/Urinary Tract: --Adrenal glands: No adrenal hemorrhage. --Right kidney/ureter: No hydronephrosis or perinephric hematoma. --Left kidney/ureter: No hydronephrosis or perinephric hematoma. --Urinary bladder: The bladder is decompressed with a Foley catheter. Stomach/Bowel: --Stomach/Duodenum: No hiatal hernia or other gastric abnormality. Normal duodenal course and caliber. --Small bowel: No dilatation or inflammation. --Colon: There is a large amount of well-formed stool at the level of the rectum. There is a moderate amount of stool throughout the remaining portions of the colon. --Appendix: Not visualized. No right lower quadrant inflammation or free fluid. Vascular/Lymphatic: Advanced vascular calcifications are noted. The infrarenal abdominal aorta measures up to approximately 2.8 cm in diameter. --No retroperitoneal lymphadenopathy. --No mesenteric lymphadenopathy. --No pelvic or inguinal lymphadenopathy. Reproductive: The prostate gland is enlarged. Other: No ascites or free air. There is mild body wall edema. Musculoskeletal. No acute displaced fractures. IMPRESSION: 1. No acute intracranial abnormality. Advanced atrophy and chronic microvascular ischemic changes are noted. 2. No acute cervical spine fracture. 3. No acute facial bone fracture, however evaluation was limited by significant motion artifact. 4. Multiple lung findings are noted as detailed above with differential considerations fibrotic lung disease with a superimposed atypical infectious process such as viral pneumonia not excluded. 5. There is a masslike airspace opacity in the right upper  lobe as detailed above. This may represent an area of consolidation, however an underlying mass is not excluded. A three-month follow-up CT of the chest is recommended. 6. Ectatic abdominal aorta at risk for aneurysm development, currently measuring 2.8 cm. Recommend followup by ultrasound in 5 years. This recommendation follows ACR consensus guidelines: White Paper of the ACR Incidental Findings Committee II on Vascular Findings. J Am Coll Radiol 2013; 10:789-794. Aortic aneurysm NOS (ICD10-I71.9) 7. Cholelithiasis with dilated common bile duct measuring up to 1.4 cm. No CT evidence of acute cholecystitis. If there is clinical concern for acute biliary pathology, recommend further evaluation with MRCP with the patient is clinically stable. An emergent MRCP would likely be degraded by significant motion artifact. 8. Large amount of well-formed stool at the level of the rectum. This may represent fecal impaction. 9. Aortic Atherosclerosis (ICD10-I70.0). Electronically Signed   By: Constance Holster M.D.   On: 12/10/2019 21:17   DG Pelvis Portable  Result Date: Dec 10, 2019 CLINICAL DATA:  Fall EXAM: PORTABLE PELVIS 1-2 VIEWS COMPARISON:  None. FINDINGS: SI joints are non widened. Numerous phleboliths in the pelvis. Pubic symphysis and rami are intact. The femoral necks are obscured by the trochanter right greater than left. Both femoral heads project in joint. No definitive fracture seen. IMPRESSION: 1. Limited evaluation of right greater than left femoral neck due to positioning and obscuration of the neck by the trochanter. 2. No definite acute osseous abnormality is seen. Electronically Signed   By: Donavan Foil M.D.   On: 2019/12/10 19:37   DG Chest Port 1 View  Result Date: 10-Dec-2019 CLINICAL DATA:  Fall EXAM: PORTABLE CHEST 1 VIEW COMPARISON:  05/15/2010 FINDINGS: Chronic elevation of left diaphragm. Mild reticular opacity at the right CP angle and base, likely scarring/fibrosis. Linear scarring or  atelectasis left base. Mild cardiomegaly with aortic atherosclerosis. No consolidation, pleural effusion, or pneumothorax. IMPRESSION: 1. Cardiomegaly without edema or focal pulmonary opacity 2. Chronic elevation of left diaphragm. Fibrosis/scarring suspected at both bases. Electronically Signed   By: Donavan Foil M.D.   On: 10-Dec-2019 19:36   DG Shoulder Right Portable  Result Date: 12-10-2019 CLINICAL DATA:  [place] today with right shoulder pain. EXAM: PORTABLE RIGHT SHOULDER COMPARISON:  None.  FINDINGS: The shoulder is held in relative internal rotation. The humeral head is properly located. Narrowed humeral acromial distance which could indicate rotator cuff pathology, either chronic or acute. AC joint unremarkable. Regional ribs are negative. IMPRESSION: No acute fracture or dislocation. Narrowed humeral acromial distance suggesting rotator cuff disease, age indeterminate. Electronically Signed   By: Paulina Fusi M.D.   On: 11/22/2019 22:18   ECHOCARDIOGRAM COMPLETE  Result Date: 12/04/2019    ECHOCARDIOGRAM REPORT   Patient Name:   GRECO GASTELUM Date of Exam: 12/04/2019 Medical Rec #:  [id number]        Height:       66.0 in Accession #:    [id number]       Weight:       130.0 lb Date of Birth:  Aug 28, 1924        BSA:          1.665 m Patient Age:    89 years         BP:           120/84 mmHg Patient Gender: M                HR:           75 bpm. Exam Location:  Inpatient Procedure: 2D Echo, Cardiac Doppler and Color Doppler Indications:    R94.31 Abnormal EKG  History:        Patient has prior history of Echocardiogram examinations, most                 recent 01/18/2011. LV dysfunction, CAD and Previous Myocardial                 Infarction, TIA, Signs/Symptoms:Altered Mental Status, Chest                 Pain and Dyspnea; Risk Factors:Hypertension and Dyslipidemia.                 Rhabdomyolysis. Pneumonia.  Sonographer:    Sheralyn Boatman RDCS Referring Phys: [id number] [facility]  Sonographer  Comments: Technically difficult study due to poor echo windows. Image acquisition challenging due to uncooperative patient. Patient has altered mental status. Very difficult study. Patient could not follow instructions and could not stop talking throughout exam. Patient could not keep arms down and out of the subcostal window. IMPRESSIONS  1. Left ventricular ejection fraction, by estimation, is 55 to 60%. The left ventricle has normal function. The left ventricle has no regional wall motion abnormalities. Left ventricular diastolic parameters are consistent with Grade I diastolic dysfunction (impaired relaxation). Elevated left ventricular end-diastolic pressure.  2. Right ventricular systolic function is normal. The right ventricular size is normal. Tricuspid regurgitation signal is inadequate for assessing PA pressure.  3. The mitral valve is normal in structure and function. Mild mitral valve regurgitation. No evidence of mitral stenosis.  4. The aortic valve was not well visualized. Severe aortic valve annular calcification. There is severe thickening of the aortic valve. There is severe calcifcation of the aortic valve. Aortic valve mean gradient measures 26.6 mmHg. Aortic valve peak gradient measures 47.2 mmHg. Aortic valve area, by VTI measures 0.74 cm. Aortic valve regurgitation is not visualized. Moderate to severe aortic valve stenosis.  5. The inferior vena cava is normal in size with greater than 50% respiratory variability, suggesting right atrial pressure of 3 mmHg.  6. Visually the AV apperas severly stenotic but images are of poor quality. The AVA is likely underestimated due to  inaccurate LVOT measurement. The dimensionless index is 0.29 and mean gradient . SVI is reduced at 34. Findings c/w moderate to severe AS. 7. Consider repeat echo to reassess AV once patient is less combative.  FINDINGS  Left Ventricle: Left ventricular ejection fraction, by estimation, is 55 to 60%. The left  ventricle has normal function. The left ventricle has no regional wall motion abnormalities. The left ventricular internal cavity size was normal in size. There is  no left ventricular hypertrophy. Left ventricular diastolic parameters are consistent with Grade I diastolic dysfunction (impaired relaxation). Elevated left ventricular end-diastolic pressure. Right Ventricle: The right ventricular size is normal. No increase in right ventricular wall thickness. Right ventricular systolic function is normal. Tricuspid regurgitation signal is inadequate for assessing PA pressure. Left Atrium: Left atrial size was normal in size. Right Atrium: Right atrial size was normal in size. Pericardium: There is no evidence of pericardial effusion. Mitral Valve: The mitral valve is normal in structure and function. There is mild thickening of the anterior and posterior mitral valve leaflet(s). Normal mobility of the mitral valve leaflets. Mild mitral annular calcification. Mild mitral valve regurgitation. No evidence of mitral valve stenosis. Tricuspid Valve: The tricuspid valve is normal in structure. Tricuspid valve regurgitation is not demonstrated. No evidence of tricuspid stenosis. Aortic Valve: The aortic valve was not well visualized. . There is severe thickening and severe calcifcation of the aortic valve. Aortic valve regurgitation is not visualized. Moderate to severe aortic stenosis is present. Severe aortic valve annular calcification. There is severe thickening of the aortic valve. There is severe calcifcation of the aortic valve. Aortic valve mean gradient measures 30.0 mmHg. Aortic valve peak gradient measures 54.2 mmHg. Aortic valve area, by VTI measures 0.74 cm. Pulmonic Valve: The pulmonic valve was normal in structure. Pulmonic valve regurgitation is not visualized. No evidence of pulmonic stenosis. Aorta: The aortic root is normal in size and structure. Venous: The inferior vena cava is normal in size with  greater than 50% respiratory variability, suggesting right atrial pressure of 3 mmHg. IAS/Shunts: The interatrial septum appears to be lipomatous. No atrial level shunt detected by color flow Doppler.  LEFT VENTRICLE PLAX 2D LVIDd:         4.30 cm     Diastology LVIDs:         3.40 cm     LV e' lateral:   5.98 cm/s LV PW:         1.20 cm     LV E/e' lateral: 9.6 LV IVS:        1.00 cm     LV e' medial:    3.70 cm/s LVOT diam:     1.80 cm     LV E/e' medial:  15.6 LV SV:         56 LV SV Index:   34 LVOT Area:     2.54 cm  LV Volumes (MOD) LV vol d, MOD A2C: 50.0 ml LV vol d, MOD A4C: 87.1 ml LV vol s, MOD A2C: 24.3 ml LV vol s, MOD A4C: 38.5 ml LV SV MOD A2C:     25.7 ml LV SV MOD A4C:     87.1 ml LV SV MOD BP:      34.1 ml RIGHT VENTRICLE             IVC RV S prime:     13.30 cm/s  IVC diam: 1.50 cm TAPSE (M-mode): 2.1 cm LEFT ATRIUM  Index       RIGHT ATRIUM          Index LA diam:        3.20 cm 1.92 cm/m  RA Area:     8.52 cm LA Vol (A2C):   25.0 ml 15.01 ml/m RA Volume:   14.40 ml 8.65 ml/m LA Vol (A4C):   34.2 ml 20.54 ml/m LA Biplane Vol: 29.9 ml 17.96 ml/m  AORTIC VALVE AV Area (Vmax):    0.67 cm AV Area (Vmean):   0.73 cm AV Area (VTI):     0.74 cm AV Vmax:           368.00 cm/s AV Vmean:          238.800 cm/s AV VTI:            0.761 m AV Peak Grad:      54.2 mmHg AV Mean Grad:      30.0 mmHg LVOT Vmax:         97.00 cm/s LVOT Vmean:        68.600 cm/s LVOT VTI:          0.222 m LVOT/AV VTI ratio: 0.29  AORTA Ao Root diam: 3.00 cm MITRAL VALVE MV Area (PHT): 3.77 cm    SHUNTS MV Decel Time: 201 msec    Systemic VTI:  0.22 m MV E velocity: 57.70 cm/s  Systemic Diam: 1.80 cm MV A velocity: 84.40 cm/s MV E/A ratio:  0.68 Armanda Magic MD Electronically signed by Armanda Magic MD Signature Date/Time: 12/04/2019/12:34:16 PM    Final    CT Maxillofacial Wo Contrast  Result Date: 12/05/2019 CLINICAL DATA:  Trauma. EXAM: CT HEAD WITHOUT CONTRAST CT MAXILLOFACIAL WITHOUT CONTRAST CT CERVICAL  SPINE WITHOUT CONTRAST CT CHEST, ABDOMEN AND PELVIS WITHOUT CONTRAST TECHNIQUE: Contiguous axial images were obtained from the base of the skull through the vertex without intravenous contrast. Multidetector CT imaging of the maxillofacial structures was performed. Multiplanar CT image reconstructions were also generated. A small metallic BB was placed on the right temple in order to reliably differentiate right from left. Multidetector CT imaging of the cervical spine was performed without intravenous contrast. Multiplanar CT image reconstructions were also generated. Multidetector CT imaging of the chest, abdomen and pelvis was performed following the standard protocol without IV contrast. COMPARISON:  None. FINDINGS: CT HEAD FINDINGS Brain: No evidence of acute infarction, hemorrhage, hydrocephalus, extra-axial collection or mass lesion/mass effect. There is extensive volume loss in addition to extensive chronic microvascular ischemic changes. 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 Vascular: No hyperdense vessel or unexpected calcification. Skull: Normal. Negative for fracture or focal lesion. Other: None. CT MAXILLOFACIAL FINDINGS Osseous: The evaluation of the osseous structures is significantly limited by motion artifact. Given this limitation, there is no definite acute osseous abnormality. Orbits: Negative. No traumatic or inflammatory finding. Sinuses: Clear. Soft tissues: Negative. CT CERVICAL SPINE FINDINGS Alignment: Normal. Skull base and vertebrae: No acute fracture. No primary bone lesion or focal pathologic process. Soft tissues and spinal canal: No prevertebral fluid or swelling. No visible canal hematoma. Disc levels: Multilevel degenerative changes are noted throughout the cervical spine, greatest at the C5-C6 and C6-C7 levels. Other: None. CT CHEST FINDINGS Cardiovascular: Heart size is normal. Advanced atherosclerotic changes are noted of the thoracic aorta and coronary  arteries. There is no significant pericardial effusion. Mediastinum/Nodes: --No mediastinal or hilar lymphadenopathy. --No axillary lymphadenopathy. --No supraclavicular lymphadenopathy. --Normal thyroid gland. --The esophagus is unremarkable Lungs/Pleura: There are areas  of honeycombing involving the lung bases and lung apices. There are coarse reticular lung markings bilaterally. There is a somewhat masslike airspace opacity involving the right upper lobe measuring approximately 1.8 x 2.2 cm (axial series 5, image 57). The trachea is unremarkable. There is no pneumothorax or significant pleural effusion. The left hemidiaphragm is elevated. Musculoskeletal: No chest wall abnormality. No acute or significant osseous findings. CT ABDOMEN AND PELVIS FINDINGS Hepatobiliary: The liver is normal. Cholelithiasis without acute inflammation.The common bile duct is dilated measuring up to approximately 1.4 cm. Pancreas: Normal contours without ductal dilatation. No peripancreatic fluid collection. Spleen: No splenic laceration or hematoma. Adrenals/Urinary Tract: --Adrenal glands: No adrenal hemorrhage. --Right kidney/ureter: No hydronephrosis or perinephric hematoma. --Left kidney/ureter: No hydronephrosis or perinephric hematoma. --Urinary bladder: The bladder is decompressed with a Foley catheter. Stomach/Bowel: --Stomach/Duodenum: No hiatal hernia or other gastric abnormality. Normal duodenal course and caliber. --Small bowel: No dilatation or inflammation. --Colon: There is a large amount of well-formed stool at the level of the rectum. There is a moderate amount of stool throughout the remaining portions of the colon. --Appendix: Not visualized. No right lower quadrant inflammation or free fluid. Vascular/Lymphatic: Advanced vascular calcifications are noted. The infrarenal abdominal aorta measures up to approximately 2.8 cm in diameter. --No retroperitoneal lymphadenopathy. --No mesenteric lymphadenopathy. --No pelvic  or inguinal lymphadenopathy. Reproductive: The prostate gland is enlarged. Other: No ascites or free air. There is mild body wall edema. Musculoskeletal. No acute displaced fractures. IMPRESSION: 1. No acute intracranial abnormality. Advanced atrophy and chronic microvascular ischemic changes are noted. 2. No acute cervical spine fracture. 3. No acute facial bone fracture, however evaluation was limited by significant motion artifact. 4. Multiple lung findings are noted as detailed above with differential considerations fibrotic lung disease with a superimposed atypical infectious process such as viral pneumonia not excluded. 5. There is a masslike airspace opacity in the right upper lobe as detailed above. This may represent an area of consolidation, however an underlying mass is not excluded. A three-month follow-up CT of the chest is recommended. 6. Ectatic abdominal aorta at risk for aneurysm development, currently measuring 2.8 cm. Recommend followup by ultrasound in 5 years. This recommendation follows ACR consensus guidelines: White Paper of the ACR Incidental Findings Committee II on Vascular Findings. J Am Coll Radiol 2013; 10:789-794. Aortic aneurysm NOS (ICD10-I71.9) 7. Cholelithiasis with dilated common bile duct measuring up to 1.4 cm. No CT evidence of acute cholecystitis. If there is clinical concern for acute biliary pathology, recommend further evaluation with MRCP with the patient is clinically stable. An emergent MRCP would likely be degraded by significant motion artifact. 8. Large amount of well-formed stool at the level of the rectum. This may represent fecal impaction. 9. Aortic Atherosclerosis (ICD10-I70.0). Electronically Signed   By: Katherine Mantle M.D.   On: 11/19/2019 21:17   US Abdomen Limited RUQ  Result Date: 12/04/2019 CLINICAL DATA:  Elevated LFTs, altered mental status EXAM: ULTRASOUND ABDOMEN LIMITED RIGHT UPPER QUADRANT COMPARISON:  CT abdomen pelvis 11/30/2019 FINDINGS:  Gallbladder: Echogenic, shadowing gallstone noted in the neck of the gallbladder measuring up to 1.4 cm in maximum diameter. No gallbladder wall thickening or pericholecystic fluid. Sonographic Eulah Pont sign is reportedly negative. Common bile duct: Diameter: 12 mm, dilated.  No visible intraductal gallstones. Liver: No focal lesion identified. Parenchymal echogenicity at the upper limits of normal. Portal vein is patent on color Doppler imaging with normal direction of blood flow towards the liver. Other: None. IMPRESSION: Cholelithiasis without evidence of acute cholecystitis.  Extrahepatic biliary ductal dilatation without visible stone. Distal duct is poorly assessed. If concern for choledocholithiasis, correlate with serologies and consider MRCP for further evaluation. Electronically Signed   By: Kreg Shropshire M.D.   On: 12/04/2019 03:54     Scheduled Meds: . bisacodyl  10 mg Rectal Once  . Chlorhexidine Gluconate Cloth  6 each Topical Daily  . heparin  5,000 Units Subcutaneous Q8H  . polyethylene glycol  17 g Oral Daily   Continuous Infusions: . ceFEPime (MAXIPIME) IV    . vancomycin Stopped (12/04/19 0135)     LOS: 1 day    Time spent: 35 minutes   Sharlene Dory, DO [facility] 12/04/2019, 12:55 PM   Available via Epic secure chat 7am-7pm After these hours, please refer to coverage provider listed on [email or web address]

## 2019-12-04 NOTE — Progress Notes (Signed)
Patient very agitated again. Was called by setter. Patient trying to get out of bed. Legs over the railing. crusing, yelling. Gave Ativan 2 mg I.V. slowly R.N. aware.

## 2019-12-04 NOTE — Progress Notes (Signed)
Patient calm down on his own. Awake and confused not as agitated will hold off on Ativan at this time.

## 2019-12-04 NOTE — Progress Notes (Addendum)
Setter in room and call for assist. Patient is trying to get out of bed. Legs over railing. Agitated,yelling, cursing, pulling on tubes. Kicking at staff and kicking the foot board of the bed with his feet hard. Bodenheimer N.P. text paged.

## 2019-12-04 NOTE — Progress Notes (Signed)
Pt transferred from ED to 4E17, appeared confused, disoriented to time, place and situations, Pt pulled monitor out. Reoriented and  Pt' son helping at bedside.  CHG bath given, CCMD called and 2nd person verified, call bell with in reach, room equipment and visitor instruction given to Pt' son. Vital signs with in defined limits. EKG sinus rhythm with LBBB on the monitor.    Assessed skin damaged from fall on right arm, right hip and right knee.  MD aware.   Will continue to monitor  Filiberto Pinks, RN

## 2019-12-04 NOTE — Progress Notes (Addendum)
Date and time results received: 12/04/19 at 03: 46 am.  Test: Troponin  Critical Value: [id number]  Name of Provider Notified: Jerene Dilling, NP on call provider.  No order received, MD aware about Rhabdomyolysis.   Pt denied chest pain. No acute cardiac diatress. Vital remained stable. Will continue to monitor.  Filiberto Pinks, RN

## 2019-12-04 NOTE — Progress Notes (Signed)
  Echocardiogram 2D Echocardiogram has been performed.  Leonard Simpson 12/04/2019, 11:32 AM
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
## 2019-12-04 NOTE — Progress Notes (Signed)
Pharmacy Antibiotic Note  Leonard Simpson is a 86 y.o. male admitted on 2019-12-15 with pneumonia.  Pharmacy has been consulted for Cefepime dosing.Already on vancomycin. WBC elevated. Noted renal dysfunction   Plan: Cefepime 2g IV q24h Already on vancomycin  Trend WBC, temp, renal function  F/U infectious work-up   Height: 5\' 6"  (167.6 cm) Weight: 130 lb (59 kg) IBW/kg (Calculated) : 63.8  Temp (24hrs), Avg:97.9 F (36.6 C), Min:97.5 F (36.4 C), Max:98.2 F (36.8 C)  Recent Labs  Lab 2019-12-15 1905 12-15-19 1955 2019/12/15 2011 12-15-19 2242  WBC  --  20.6*  --   --   CREATININE 2.10* 1.98*  --   --   LATICACIDVEN  --   --  4.5* 1.7    Estimated Creatinine Clearance: 19.5 mL/min (A) (by C-G formula based on SCr of 1.98 mg/dL (H)).    No Known Allergies  12/05/19, PharmD, BCPS Clinical Pharmacist Phone: [phone]

## 2019-12-05 DIAGNOSIS — A419 Sepsis, unspecified organism: Secondary | ICD-10-CM | POA: Diagnosis present

## 2019-12-05 DIAGNOSIS — S31109A Unspecified open wound of abdominal wall, unspecified quadrant without penetration into peritoneal cavity, initial encounter: Secondary | ICD-10-CM | POA: Diagnosis present

## 2019-12-05 DIAGNOSIS — F0390 Unspecified dementia without behavioral disturbance: Secondary | ICD-10-CM | POA: Diagnosis present

## 2019-12-05 LAB — CK: Total CK: 940 U/L — ABNORMAL HIGH (ref 49–397)

## 2019-12-05 LAB — CBC
HCT: 33.4 % — ABNORMAL LOW (ref 39.0–52.0)
Hemoglobin: 10.9 g/dL — ABNORMAL LOW (ref 13.0–17.0)
MCH: 32 pg (ref 26.0–34.0)
MCHC: 32.6 g/dL (ref 30.0–36.0)
MCV: 97.9 fL (ref 80.0–100.0)
Platelets: 96 K/uL — ABNORMAL LOW (ref 150–400)
RBC: 3.41 MIL/uL — ABNORMAL LOW (ref 4.22–5.81)
RDW: 14.9 % (ref 11.5–15.5)
WBC: 9.6 K/uL (ref 4.0–10.5)
nRBC: 0 % (ref 0.0–0.2)

## 2019-12-05 LAB — COMPREHENSIVE METABOLIC PANEL WITH GFR
ALT: 41 U/L (ref 0–44)
AST: 79 U/L — ABNORMAL HIGH (ref 15–41)
Albumin: 2.3 g/dL — ABNORMAL LOW (ref 3.5–5.0)
Alkaline Phosphatase: 48 U/L (ref 38–126)
Anion gap: 10 (ref 5–15)
BUN: 45 mg/dL — ABNORMAL HIGH (ref 8–23)
CO2: 24 mmol/L (ref 22–32)
Calcium: 8.5 mg/dL — ABNORMAL LOW (ref 8.9–10.3)
Chloride: 109 mmol/L (ref 98–111)
Creatinine, Ser: 1.91 mg/dL — ABNORMAL HIGH (ref 0.61–1.24)
GFR calc Af Amer: 34 mL/min — ABNORMAL LOW
GFR calc non Af Amer: 30 mL/min — ABNORMAL LOW
Glucose, Bld: 81 mg/dL (ref 70–99)
Potassium: 3.7 mmol/L (ref 3.5–5.1)
Sodium: 143 mmol/L (ref 135–145)
Total Bilirubin: 1.1 mg/dL (ref 0.3–1.2)
Total Protein: 5.4 g/dL — ABNORMAL LOW (ref 6.5–8.1)

## 2019-12-05 MED ORDER — OLANZAPINE 5 MG PO TBDP
5.0000 mg | ORAL_TABLET | Freq: Every day | ORAL | Status: DC
  Administered 2019-12-06 – 2019-12-07 (×2): 5 mg via ORAL
  Filled 2019-12-05 (×4): qty 1

## 2019-12-05 MED ORDER — HALOPERIDOL LACTATE 5 MG/ML IJ SOLN
INTRAMUSCULAR | Status: AC
  Filled 2019-12-05: qty 1

## 2019-12-05 MED ORDER — COLLAGENASE 250 UNIT/GM EX OINT
TOPICAL_OINTMENT | Freq: Every day | CUTANEOUS | Status: DC
  Administered 2019-12-06 – 2019-12-07 (×2): 1 via TOPICAL
  Filled 2019-12-05: qty 30

## 2019-12-05 MED ORDER — HALOPERIDOL LACTATE 5 MG/ML IJ SOLN
5.0000 mg | Freq: Once | INTRAMUSCULAR | Status: AC
  Administered 2019-12-05: 5 mg via INTRAVENOUS

## 2019-12-05 NOTE — Progress Notes (Signed)
Dressing applied to right and left knee, right abdominal/flank area, and left toe as ordered by the WOC nurse. Patient tolerated well. Left elbow foam dressing in place.

## 2019-12-05 NOTE — Progress Notes (Signed)
Right lower abdo quad. Has wound from fall. It has yellow pus blister in the middle of it and it is draining some. Applied dry quaze. M.D. please assess this a.m.

## 2019-12-05 NOTE — Plan of Care (Signed)
Continue to monitor

## 2019-12-05 NOTE — Progress Notes (Addendum)
Patient very confused and agitated . Legs over railing. Pulling Tele. And cloths off. Pulling on I.v. Pulling dressings off. Mittens applied for safety. No setter avail. At this time. Text page [employer] of the above

## 2019-12-05 NOTE — Progress Notes (Signed)
PROGRESS NOTE    Leonard Simpson  HEN:[id number] DOB: 04/16/1924 DOA: Dec 22, 2019 PCP: [facility]     Brief Narrative:  Patient is a 86 year old male with a medical history significant for CAD, hypertension, hyperlipidemia, BPH, GERD, and what sounds like dementia who presents for altered mental status and a fall on December 22, 2019.  Patient was found by son on the floor and noted to have multiple sores all over his body.  He was confused/disoriented on presentation.  His son helps him with ADLs.  He was at his baseline 2 days prior to admission when he was last seen.  CT chest concerning for possible pneumonia, Covid testing negative.  Treatment for rhabdomyolysis with fluids was initiated in addition to vancomycin and cefepime for pneumonia.   New events last 24 hours / Subjective: Yesterday evening the patient became more agitated.  He is giving 2 mg of Ativan.  This morning he is quite tired.  He wakes to verbal stimuli but falls back asleep.  Does not answer questions intelligibly.  This is not much changed from yesterday though.  The overnight team noticed a wound in his right lower quadrant area.    Assessment & Plan:   Principal Problem:   Rhabdomyolysis  CK improving  Continue LR 125 mL/hour  Continue to hold statin  Monitor renal panel and hep function panel   Active Problems:   Sepsis (HCC)  Resolved  Treatment for pneumonia as below  White blood cell count improved to 9.6 from 15.7 yesterday   Blood cultures show no growth to date at this point    Coronary artery disease  SL Nitro 0.4 mg prn     Hypertension  Normotensive, continue to hold home medications    Hypercholesterolemia  Hold statin    GERD (gastroesophageal reflux disease)  Protonix 20 mg daily    Elevated LFTs  Improving  2/2 rhabdo    AKI (acute kidney injury) (HCC)  Slightly better  Hold nephrotoxins  IVFs as above  BMP    Pneumonia  Continue vancomycin and cefepime, day 3/5   Acute metabolic encephalopathy  Unclear baseline  Try to avoid benzodiazepines  1:1 letter    Fall  PT/OT    Elevated troponin I level  This was discussed with cardiology, likely more related to kidney injury    BPH (benign prostatic hyperplasia)  Flomax 0.4 mg daily    Dementia (HCC)  Add Zyprexa 5 mg nightly, I think this will help [place] avoid benzos    Wound of abdomen  Appreciate wound care team, will follow up with CT abdomen/pelvis to evaluate for possible underlying abscess   DVT prophylaxis: Heparin Code Status: DNR Family Communication: Son, Ossie updated today Coming From: Home Disposition Plan: SNF Barriers to Discharge: Approval, clinical improvement    Antimicrobials:  Anti-infectives (From admission, onward)   Start     Dose/Rate Route Frequency Ordered Stop   12/04/19 2200  ceFEPIme (MAXIPIME) 2 g in sodium chloride 0.9 % 100 mL IVPB     2 g 200 mL/hr over 30 Minutes Intravenous Every 24 hours 12/04/19 0048     Dec 22, 2019 2245  vancomycin (VANCOCIN) IVPB 1000 mg/200 mL premix     1,000 mg 200 mL/hr over 60 Minutes Intravenous Every 48 hours 22-Dec-2019 2230     12-22-2019 2215  ceFEPIme (MAXIPIME) 2 g in sodium chloride 0.9 % 100 mL IVPB     2 g 200 mL/hr over 30 Minutes Intravenous  Once 12-22-19 2207 12/04/19 0258  Objective: Vitals:   12/05/19 0700 12/05/19 0800 12/05/19 1010 12/05/19 1020  BP:    120/82  Pulse: 76 78 62 66  Resp: 20 16 16 17   Temp:    97.7 F (36.5 C)  TempSrc:    Oral  SpO2: 96% 98% 99% 100%  Weight:      Height:        Intake/Output Summary (Last 24 hours) at 12/05/2019 1354 Last data filed at 12/05/2019 1342 Gross per 24 hour  Intake 500 ml  Output 1900 ml  Net -1400 ml   Filed Weights   12/04/2019 1850  Weight: 59 kg    Examination:  General exam: Appears calm and comfortable  Respiratory system: Clear to auscultation. Respiratory effort normal. No respiratory distress. No conversational dyspnea.  Cardiovascular  system: S1 & S2 heard, RRR. No pedal edema. Gastrointestinal system: Abdomen is nondistended, soft and nontender. Normal bowel sounds heard. Central nervous system: Asleep, opens eyes and unintelligibly responds to verbal stimuli Extremities: Symmetric in appearance  Skin: Over the right lower quadrant of the abdomen, there is an elliptically shaped wound with excoriated borders and mild erythema, there is a central area of looser skin with mild fluctuance; there is no opening or induration; the patient did not appear uncomfortable while I was palpating Psychiatry: Very limited judgement and insight.   Data Reviewed: I have personally reviewed following labs and imaging studies  CBC: Recent Labs  Lab 11/24/2019 1905 11/27/2019 1955 12/04/19 0353 12/05/19 0704  WBC  --  20.6* 15.7* 9.6  HGB 15.6 13.1 11.6* 10.9*  HCT 46.0 42.4 35.6* 33.4*  MCV  --  102.4* 98.1 97.9  PLT  --  108* 112* 96*   Basic Metabolic Panel: Recent Labs  Lab 12/02/2019 1905 11/15/2019 1955 12/04/19 0103 12/04/19 0353 12/05/19 0704  NA 141 141  --  142 143  K 5.1 5.9*  --  3.9 3.7  CL 110 109  --  110 109  CO2  --  21*  --  22 24  GLUCOSE 91 89  --  119* 81  BUN 61* 42*  --  45* 45*  CREATININE 2.10* 1.98*  --  1.92* 1.91*  CALCIUM  --  8.3*  --  7.7* 8.5*  MG  --   --  1.9  --   --    GFR: Estimated Creatinine Clearance: 20.2 mL/min (A) (by C-G formula based on SCr of 1.91 mg/dL (H)). Liver Function Tests: Recent Labs  Lab 11/29/2019 1955 12/04/19 0353 12/05/19 0704  AST 152* 115* 79*  ALT 51* 40 41  ALKPHOS 52 43 48  BILITOT 1.7* 0.9 1.1  PROT 5.9* 5.4* 5.4*  ALBUMIN 2.8* 2.4* 2.3*   Recent Labs  Lab 12/04/19 0106  AMMONIA 32   Coagulation Profile: Recent Labs  Lab 11/29/2019 2011  INR 1.1   Cardiac Enzymes: Recent Labs  Lab 12/02/2019 1955 12/04/19 0353 12/05/19 0704  CKTOTAL 3,222* 2,231* 940*   CBG: Recent Labs  Lab 11/30/2019 1846  GLUCAP 83   Thyroid Function Tests: Recent  Labs    12/04/19 0106  TSH 4.041   Anemia Panel: Recent Labs    12/04/19 0106  VITAMINB12 400   Sepsis Labs: Recent Labs  Lab 11/13/2019 2011 12/06/2019 2242 12/04/19 0353  PROCALCITON  --   --  0.61  LATICACIDVEN 4.5* 1.7  --     Recent Results (from the past 240 hour(s))  Blood culture (routine x 2)     Status:  None (Preliminary result)   Collection Time: 11/17/2019 10:42 PM   Specimen: BLOOD  Result Value Ref Range Status   Specimen Description BLOOD LEFT HAND  Final   Special Requests   Final    BOTTLES DRAWN AEROBIC ONLY Blood Culture results may not be optimal due to an inadequate volume of blood received in culture bottles Performed at [facility], [address], [place], [place] [address]    Culture NO GROWTH < 24 HOURS  Final   Report Status PENDING  Incomplete  Blood culture (routine x 2)     Status: None (Preliminary result)   Collection Time: 11/29/2019 10:42 PM   Specimen: BLOOD  Result Value Ref Range Status   Specimen Description BLOOD LEFT ARM  Final   Special Requests   Final    BOTTLES DRAWN AEROBIC ONLY Blood Culture results may not be optimal due to an inadequate volume of blood received in culture bottles Performed at [facility], [address], [place], [place] [address]    Culture NO GROWTH < 24 HOURS  Final   Report Status PENDING  Incomplete  SARS CORONAVIRUS 2 (TAT 6-24 HRS) Nasopharyngeal Nasopharyngeal Swab     Status: None   Collection Time: 12/04/19  1:00 AM   Specimen: Nasopharyngeal Swab  Result Value Ref Range Status   SARS Coronavirus 2 NEGATIVE NEGATIVE Final    Comment: (NOTE) SARS-CoV-2 target nucleic acids are NOT DETECTED. The SARS-CoV-2 RNA is generally detectable in upper and lower respiratory specimens during the acute phase of infection. Negative results do not preclude SARS-CoV-2 infection, do not rule out co-infections with other pathogens, and should not be used as the sole basis for treatment or other  patient management decisions. Negative results must be combined with clinical observations, patient history, and epidemiological information. The expected result is Negative. Fact Sheet for Patients: HairSlick.no Fact Sheet for Healthcare Providers: quierodirigir.com This test is not yet approved or cleared by the [place] FDA and  has been authorized for detection and/or diagnosis of SARS-CoV-2 by FDA under an Emergency Use Authorization (EUA). This EUA will remain  in effect (meaning this test can be used) for the duration of the COVID-19 declaration under Section 56 4(b)(1) of the Act, 21 U.S.C. section 360bbb-3(b)(1), unless the authorization is terminated or revoked sooner. Performed at [facility], [address], [place], [place] [address]       Radiology Studies: CT ABDOMEN PELVIS WO CONTRAST  Result Date: 12/02/2019 CLINICAL DATA:  Trauma. EXAM: CT HEAD WITHOUT CONTRAST CT MAXILLOFACIAL WITHOUT CONTRAST CT CERVICAL SPINE WITHOUT CONTRAST CT CHEST, ABDOMEN AND PELVIS WITHOUT CONTRAST TECHNIQUE: Contiguous axial images were obtained from the base of the skull through the vertex without intravenous contrast. Multidetector CT imaging of the maxillofacial structures was performed. Multiplanar CT image reconstructions were also generated. A small metallic BB was placed on the right temple in order to reliably differentiate right from left. Multidetector CT imaging of the cervical spine was performed without intravenous contrast. Multiplanar CT image reconstructions were also generated. Multidetector CT imaging of the chest, abdomen and pelvis was performed following the standard protocol without IV contrast. COMPARISON:  None. FINDINGS: CT HEAD FINDINGS Brain: No evidence of acute infarction, hemorrhage, hydrocephalus, extra-axial collection or mass lesion/mass effect. There is extensive volume loss in addition to extensive  chronic microvascular ischemic changes. 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 Vascular: No hyperdense vessel or unexpected calcification. Skull: Normal. Negative for fracture  or focal lesion. Other: None. CT MAXILLOFACIAL FINDINGS Osseous: The evaluation of the osseous structures is significantly limited by motion artifact. Given this limitation, there is no definite acute osseous abnormality. Orbits: Negative. No traumatic or inflammatory finding. Sinuses: Clear. Soft tissues: Negative. CT CERVICAL SPINE FINDINGS Alignment: Normal. Skull base and vertebrae: No acute fracture. No primary bone lesion or focal pathologic process. Soft tissues and spinal canal: No prevertebral fluid or swelling. No visible canal hematoma. Disc levels: Multilevel degenerative changes are noted throughout the cervical spine, greatest at the C5-C6 and C6-C7 levels. Other: None. CT CHEST FINDINGS Cardiovascular: Heart size is normal. Advanced atherosclerotic changes are noted of the thoracic aorta and coronary arteries. There is no significant pericardial effusion. Mediastinum/Nodes: --No mediastinal or hilar lymphadenopathy. --No axillary lymphadenopathy. --No supraclavicular lymphadenopathy. --Normal thyroid gland. --The esophagus is unremarkable Lungs/Pleura: There are areas of honeycombing involving the lung bases and lung apices. There are coarse reticular lung markings bilaterally. There is a somewhat masslike airspace opacity involving the right upper lobe measuring approximately 1.8 x 2.2 cm (axial series 5, image 57). The trachea is unremarkable. There is no pneumothorax or significant pleural effusion. The left hemidiaphragm is elevated. Musculoskeletal: No chest wall abnormality. No acute or significant osseous findings. CT ABDOMEN AND PELVIS FINDINGS Hepatobiliary: The liver is normal. Cholelithiasis without acute inflammation.The common bile duct is dilated measuring up to approximately 1.4 cm. Pancreas:  Normal contours without ductal dilatation. No peripancreatic fluid collection. Spleen: No splenic laceration or hematoma. Adrenals/Urinary Tract: --Adrenal glands: No adrenal hemorrhage. --Right kidney/ureter: No hydronephrosis or perinephric hematoma. --Left kidney/ureter: No hydronephrosis or perinephric hematoma. --Urinary bladder: The bladder is decompressed with a Foley catheter. Stomach/Bowel: --Stomach/Duodenum: No hiatal hernia or other gastric abnormality. Normal duodenal course and caliber. --Small bowel: No dilatation or inflammation. --Colon: There is a large amount of well-formed stool at the level of the rectum. There is a moderate amount of stool throughout the remaining portions of the colon. --Appendix: Not visualized. No right lower quadrant inflammation or free fluid. Vascular/Lymphatic: Advanced vascular calcifications are noted. The infrarenal abdominal aorta measures up to approximately 2.8 cm in diameter. --No retroperitoneal lymphadenopathy. --No mesenteric lymphadenopathy. --No pelvic or inguinal lymphadenopathy. Reproductive: The prostate gland is enlarged. Other: No ascites or free air. There is mild body wall edema. Musculoskeletal. No acute displaced fractures. IMPRESSION: 1. No acute intracranial abnormality. Advanced atrophy and chronic microvascular ischemic changes are noted. 2. No acute cervical spine fracture. 3. No acute facial bone fracture, however evaluation was limited by significant motion artifact. 4. Multiple lung findings are noted as detailed above with differential considerations fibrotic lung disease with a superimposed atypical infectious process such as viral pneumonia not excluded. 5. There is a masslike airspace opacity in the right upper lobe as detailed above. This may represent an area of consolidation, however an underlying mass is not excluded. A three-month follow-up CT of the chest is recommended. 6. Ectatic abdominal aorta at risk for aneurysm development,  currently measuring 2.8 cm. Recommend followup by ultrasound in 5 years. This recommendation follows ACR consensus guidelines: White Paper of the ACR Incidental Findings Committee II on Vascular Findings. J Am Coll Radiol 2013; 10:789-794. Aortic aneurysm NOS (ICD10-I71.9) 7. Cholelithiasis with dilated common bile duct measuring up to 1.4 cm. No CT evidence of acute cholecystitis. If there is clinical concern for acute biliary pathology, recommend further evaluation with MRCP with the patient is clinically stable. An emergent MRCP would likely be degraded by significant motion artifact. 8. Large amount  of well-formed stool at the level of the rectum. This may represent fecal impaction. 9. Aortic Atherosclerosis (ICD10-I70.0). Electronically Signed   By: Katherine Mantle M.D.   On: 2020/01/02 21:17   CT Head Wo Contrast  Result Date: 01/02/20 CLINICAL DATA:  Trauma. EXAM: CT HEAD WITHOUT CONTRAST CT MAXILLOFACIAL WITHOUT CONTRAST CT CERVICAL SPINE WITHOUT CONTRAST CT CHEST, ABDOMEN AND PELVIS WITHOUT CONTRAST TECHNIQUE: Contiguous axial images were obtained from the base of the skull through the vertex without intravenous contrast. Multidetector CT imaging of the maxillofacial structures was performed. Multiplanar CT image reconstructions were also generated. A small metallic BB was placed on the right temple in order to reliably differentiate right from left. Multidetector CT imaging of the cervical spine was performed without intravenous contrast. Multiplanar CT image reconstructions were also generated. Multidetector CT imaging of the chest, abdomen and pelvis was performed following the standard protocol without IV contrast. COMPARISON:  None. FINDINGS: CT HEAD FINDINGS Brain: No evidence of acute infarction, hemorrhage, hydrocephalus, extra-axial collection or mass lesion/mass effect. There is extensive volume loss in addition to extensive chronic microvascular ischemic changes. There is a chronic  appearing right occipital lobe infarct. Vascular: No hyperdense vessel or unexpected calcification. Skull: Normal. Negative for fracture or focal lesion. Other: None. CT MAXILLOFACIAL FINDINGS Osseous: The evaluation of the osseous structures is significantly limited by motion artifact. Given this limitation, there is no definite acute osseous abnormality. Orbits: Negative. No traumatic or inflammatory finding. Sinuses: Clear. Soft tissues: Negative. CT CERVICAL SPINE FINDINGS Alignment: Normal. Skull base and vertebrae: No acute fracture. No primary bone lesion or focal pathologic process. Soft tissues and spinal canal: No prevertebral fluid or swelling. No visible canal hematoma. Disc levels: Multilevel degenerative changes are noted throughout the cervical spine, greatest at the C5-C6 and C6-C7 levels. Other: None. CT CHEST FINDINGS Cardiovascular: Heart size is normal. Advanced atherosclerotic changes are noted of the thoracic aorta and coronary arteries. There is no significant pericardial effusion. Mediastinum/Nodes: --No mediastinal or hilar lymphadenopathy. --No axillary lymphadenopathy. --No supraclavicular lymphadenopathy. --Normal thyroid gland. --The esophagus is unremarkable Lungs/Pleura: There are areas of honeycombing involving the lung bases and lung apices. There are coarse reticular lung markings bilaterally. There is a somewhat masslike airspace opacity involving the right upper lobe measuring approximately 1.8 x 2.2 cm (axial series 5, image 57). The trachea is unremarkable. There is no pneumothorax or significant pleural effusion. The left hemidiaphragm is elevated. Musculoskeletal: No chest wall abnormality. No acute or significant osseous findings. CT ABDOMEN AND PELVIS FINDINGS Hepatobiliary: The liver is normal. Cholelithiasis without acute inflammation.The common bile duct is dilated measuring up to approximately 1.4 cm. Pancreas: Normal contours without ductal dilatation. No peripancreatic  fluid collection. Spleen: No splenic laceration or hematoma. Adrenals/Urinary Tract: --Adrenal glands: No adrenal hemorrhage. --Right kidney/ureter: No hydronephrosis or perinephric hematoma. --Left kidney/ureter: No hydronephrosis or perinephric hematoma. --Urinary bladder: The bladder is decompressed with a Foley catheter. Stomach/Bowel: --Stomach/Duodenum: No hiatal hernia or other gastric abnormality. Normal duodenal course and caliber. --Small bowel: No dilatation or inflammation. --Colon: There is a large amount of well-formed stool at the level of the rectum. There is a moderate amount of stool throughout the remaining portions of the colon. --Appendix: Not visualized. No right lower quadrant inflammation or free fluid. Vascular/Lymphatic: Advanced vascular calcifications are noted. The infrarenal abdominal aorta measures up to approximately 2.8 cm in diameter. --No retroperitoneal lymphadenopathy. --No mesenteric lymphadenopathy. --No pelvic or inguinal lymphadenopathy. Reproductive: The prostate gland is enlarged. Other: No ascites or free air.  There is mild body wall edema. Musculoskeletal. No acute displaced fractures. IMPRESSION: 1. No acute intracranial abnormality. Advanced atrophy and chronic microvascular ischemic changes are noted. 2. No acute cervical spine fracture. 3. No acute facial bone fracture, however evaluation was limited by significant motion artifact. 4. Multiple lung findings are noted as detailed above with differential considerations fibrotic lung disease with a superimposed atypical infectious process such as viral pneumonia not excluded. 5. There is a masslike airspace opacity in the right upper lobe as detailed above. This may represent an area of consolidation, however an underlying mass is not excluded. A three-month follow-up CT of the chest is recommended. 6. Ectatic abdominal aorta at risk for aneurysm development, currently measuring 2.8 cm. Recommend followup by ultrasound  in 5 years. This recommendation follows ACR consensus guidelines: White Paper of the ACR Incidental Findings Committee II on Vascular Findings. J Am Coll Radiol 2013; 10:789-794. Aortic aneurysm NOS (ICD10-I71.9) 7. Cholelithiasis with dilated common bile duct measuring up to 1.4 cm. No CT evidence of acute cholecystitis. If there is clinical concern for acute biliary pathology, recommend further evaluation with MRCP with the patient is clinically stable. An emergent MRCP would likely be degraded by significant motion artifact. 8. Large amount of well-formed stool at the level of the rectum. This may represent fecal impaction. 9. Aortic Atherosclerosis (ICD10-I70.0). Electronically Signed   By: Constance Holster M.D.   On: December 16, 2019 21:17   CT CHEST WO CONTRAST  Result Date: 12-16-2019 CLINICAL DATA:  Trauma. EXAM: CT HEAD WITHOUT CONTRAST CT MAXILLOFACIAL WITHOUT CONTRAST CT CERVICAL SPINE WITHOUT CONTRAST CT CHEST, ABDOMEN AND PELVIS WITHOUT CONTRAST TECHNIQUE: Contiguous axial images were obtained from the base of the skull through the vertex without intravenous contrast. Multidetector CT imaging of the maxillofacial structures was performed. Multiplanar CT image reconstructions were also generated. A small metallic BB was placed on the right temple in order to reliably differentiate right from left. Multidetector CT imaging of the cervical spine was performed without intravenous contrast. Multiplanar CT image reconstructions were also generated. Multidetector CT imaging of the chest, abdomen and pelvis was performed following the standard protocol without IV contrast. COMPARISON:  None. FINDINGS: CT HEAD FINDINGS Brain: No evidence of acute infarction, hemorrhage, hydrocephalus, extra-axial collection or mass lesion/mass effect. There is extensive volume loss in addition to extensive chronic microvascular ischemic changes. 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 Vascular: No hyperdense  vessel or unexpected calcification. Skull: Normal. Negative for fracture or focal lesion. Other: None. CT MAXILLOFACIAL FINDINGS Osseous: The evaluation of the osseous structures is significantly limited by motion artifact. Given this limitation, there is no definite acute osseous abnormality. Orbits: Negative. No traumatic or inflammatory finding. Sinuses: Clear. Soft tissues: Negative. CT CERVICAL SPINE FINDINGS Alignment: Normal. Skull base and vertebrae: No acute fracture. No primary bone lesion or focal pathologic process. Soft tissues and spinal canal: No prevertebral fluid or swelling. No visible canal hematoma. Disc levels: Multilevel degenerative changes are noted throughout the cervical spine, greatest at the C5-C6 and C6-C7 levels. Other: None. CT CHEST FINDINGS Cardiovascular: Heart size is normal. Advanced atherosclerotic changes are noted of the thoracic aorta and coronary arteries. There is no significant pericardial effusion. Mediastinum/Nodes: --No mediastinal or hilar lymphadenopathy. --No axillary lymphadenopathy. --No supraclavicular lymphadenopathy. --Normal thyroid gland. --The esophagus is unremarkable Lungs/Pleura: There are areas of honeycombing involving the lung bases and lung apices. There are coarse reticular lung markings bilaterally. There is a somewhat masslike airspace opacity involving the right upper lobe measuring  approximately 1.8 x 2.2 cm (axial series 5, image 57). The trachea is unremarkable. There is no pneumothorax or significant pleural effusion. The left hemidiaphragm is elevated. Musculoskeletal: No chest wall abnormality. No acute or significant osseous findings. CT ABDOMEN AND PELVIS FINDINGS Hepatobiliary: The liver is normal. Cholelithiasis without acute inflammation.The common bile duct is dilated measuring up to approximately 1.4 cm. Pancreas: Normal contours without ductal dilatation. No peripancreatic fluid collection. Spleen: No splenic laceration or hematoma.  Adrenals/Urinary Tract: --Adrenal glands: No adrenal hemorrhage. --Right kidney/ureter: No hydronephrosis or perinephric hematoma. --Left kidney/ureter: No hydronephrosis or perinephric hematoma. --Urinary bladder: The bladder is decompressed with a Foley catheter. Stomach/Bowel: --Stomach/Duodenum: No hiatal hernia or other gastric abnormality. Normal duodenal course and caliber. --Small bowel: No dilatation or inflammation. --Colon: There is a large amount of well-formed stool at the level of the rectum. There is a moderate amount of stool throughout the remaining portions of the colon. --Appendix: Not visualized. No right lower quadrant inflammation or free fluid. Vascular/Lymphatic: Advanced vascular calcifications are noted. The infrarenal abdominal aorta measures up to approximately 2.8 cm in diameter. --No retroperitoneal lymphadenopathy. --No mesenteric lymphadenopathy. --No pelvic or inguinal lymphadenopathy. Reproductive: The prostate gland is enlarged. Other: No ascites or free air. There is mild body wall edema. Musculoskeletal. No acute displaced fractures. IMPRESSION: 1. No acute intracranial abnormality. Advanced atrophy and chronic microvascular ischemic changes are noted. 2. No acute cervical spine fracture. 3. No acute facial bone fracture, however evaluation was limited by significant motion artifact. 4. Multiple lung findings are noted as detailed above with differential considerations fibrotic lung disease with a superimposed atypical infectious process such as viral pneumonia not excluded. 5. There is a masslike airspace opacity in the right upper lobe as detailed above. This may represent an area of consolidation, however an underlying mass is not excluded. A three-month follow-up CT of the chest is recommended. 6. Ectatic abdominal aorta at risk for aneurysm development, currently measuring 2.8 cm. Recommend followup by ultrasound in 5 years. This recommendation follows ACR consensus  guidelines: White Paper of the ACR Incidental Findings Committee II on Vascular Findings. J Am Coll Radiol 2013; 10:789-794. Aortic aneurysm NOS (ICD10-I71.9) 7. Cholelithiasis with dilated common bile duct measuring up to 1.4 cm. No CT evidence of acute cholecystitis. If there is clinical concern for acute biliary pathology, recommend further evaluation with MRCP with the patient is clinically stable. An emergent MRCP would likely be degraded by significant motion artifact. 8. Large amount of well-formed stool at the level of the rectum. This may represent fecal impaction. 9. Aortic Atherosclerosis (ICD10-I70.0). Electronically Signed   By: Katherine Mantle M.D.   On: 11/28/2019 21:17   CT Cervical Spine Wo Contrast  Result Date: 11/17/2019 CLINICAL DATA:  Trauma. EXAM: CT HEAD WITHOUT CONTRAST CT MAXILLOFACIAL WITHOUT CONTRAST CT CERVICAL SPINE WITHOUT CONTRAST CT CHEST, ABDOMEN AND PELVIS WITHOUT CONTRAST TECHNIQUE: Contiguous axial images were obtained from the base of the skull through the vertex without intravenous contrast. Multidetector CT imaging of the maxillofacial structures was performed. Multiplanar CT image reconstructions were also generated. A small metallic BB was placed on the right temple in order to reliably differentiate right from left. Multidetector CT imaging of the cervical spine was performed without intravenous contrast. Multiplanar CT image reconstructions were also generated. Multidetector CT imaging of the chest, abdomen and pelvis was performed following the standard protocol without IV contrast. COMPARISON:  None. FINDINGS: CT HEAD FINDINGS Brain: No evidence of acute infarction, hemorrhage, hydrocephalus, extra-axial collection or mass  lesion/mass effect. There is extensive volume loss in addition to extensive chronic microvascular ischemic changes. 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 Vascular: No hyperdense vessel or unexpected calcification. Skull:  Normal. Negative for fracture or focal lesion. Other: None. CT MAXILLOFACIAL FINDINGS Osseous: The evaluation of the osseous structures is significantly limited by motion artifact. Given this limitation, there is no definite acute osseous abnormality. Orbits: Negative. No traumatic or inflammatory finding. Sinuses: Clear. Soft tissues: Negative. CT CERVICAL SPINE FINDINGS Alignment: Normal. Skull base and vertebrae: No acute fracture. No primary bone lesion or focal pathologic process. Soft tissues and spinal canal: No prevertebral fluid or swelling. No visible canal hematoma. Disc levels: Multilevel degenerative changes are noted throughout the cervical spine, greatest at the C5-C6 and C6-C7 levels. Other: None. CT CHEST FINDINGS Cardiovascular: Heart size is normal. Advanced atherosclerotic changes are noted of the thoracic aorta and coronary arteries. There is no significant pericardial effusion. Mediastinum/Nodes: --No mediastinal or hilar lymphadenopathy. --No axillary lymphadenopathy. --No supraclavicular lymphadenopathy. --Normal thyroid gland. --The esophagus is unremarkable Lungs/Pleura: There are areas of honeycombing involving the lung bases and lung apices. There are coarse reticular lung markings bilaterally. There is a somewhat masslike airspace opacity involving the right upper lobe measuring approximately 1.8 x 2.2 cm (axial series 5, image 57). The trachea is unremarkable. There is no pneumothorax or significant pleural effusion. The left hemidiaphragm is elevated. Musculoskeletal: No chest wall abnormality. No acute or significant osseous findings. CT ABDOMEN AND PELVIS FINDINGS Hepatobiliary: The liver is normal. Cholelithiasis without acute inflammation.The common bile duct is dilated measuring up to approximately 1.4 cm. Pancreas: Normal contours without ductal dilatation. No peripancreatic fluid collection. Spleen: No splenic laceration or hematoma. Adrenals/Urinary Tract: --Adrenal glands: No  adrenal hemorrhage. --Right kidney/ureter: No hydronephrosis or perinephric hematoma. --Left kidney/ureter: No hydronephrosis or perinephric hematoma. --Urinary bladder: The bladder is decompressed with a Foley catheter. Stomach/Bowel: --Stomach/Duodenum: No hiatal hernia or other gastric abnormality. Normal duodenal course and caliber. --Small bowel: No dilatation or inflammation. --Colon: There is a large amount of well-formed stool at the level of the rectum. There is a moderate amount of stool throughout the remaining portions of the colon. --Appendix: Not visualized. No right lower quadrant inflammation or free fluid. Vascular/Lymphatic: Advanced vascular calcifications are noted. The infrarenal abdominal aorta measures up to approximately 2.8 cm in diameter. --No retroperitoneal lymphadenopathy. --No mesenteric lymphadenopathy. --No pelvic or inguinal lymphadenopathy. Reproductive: The prostate gland is enlarged. Other: No ascites or free air. There is mild body wall edema. Musculoskeletal. No acute displaced fractures. IMPRESSION: 1. No acute intracranial abnormality. Advanced atrophy and chronic microvascular ischemic changes are noted. 2. No acute cervical spine fracture. 3. No acute facial bone fracture, however evaluation was limited by significant motion artifact. 4. Multiple lung findings are noted as detailed above with differential considerations fibrotic lung disease with a superimposed atypical infectious process such as viral pneumonia not excluded. 5. There is a masslike airspace opacity in the right upper lobe as detailed above. This may represent an area of consolidation, however an underlying mass is not excluded. A three-month follow-up CT of the chest is recommended. 6. Ectatic abdominal aorta at risk for aneurysm development, currently measuring 2.8 cm. Recommend followup by ultrasound in 5 years. This recommendation follows ACR consensus guidelines: White Paper of the ACR Incidental  Findings Committee II on Vascular Findings. J Am Coll Radiol 2013; 10:789-794. Aortic aneurysm NOS (ICD10-I71.9) 7. Cholelithiasis with dilated common bile duct measuring up to 1.4 cm. No CT evidence  of acute cholecystitis. If there is clinical concern for acute biliary pathology, recommend further evaluation with MRCP with the patient is clinically stable. An emergent MRCP would likely be degraded by significant motion artifact. 8. Large amount of well-formed stool at the level of the rectum. This may represent fecal impaction. 9. Aortic Atherosclerosis (ICD10-I70.0). Electronically Signed   By: Katherine Mantle M.D.   On: 12/02/2019 21:17   DG Pelvis Portable  Result Date: 12/05/2019 CLINICAL DATA:  Fall EXAM: PORTABLE PELVIS 1-2 VIEWS COMPARISON:  None. FINDINGS: SI joints are non widened. Numerous phleboliths in the pelvis. Pubic symphysis and rami are intact. The femoral necks are obscured by the trochanter right greater than left. Both femoral heads project in joint. No definitive fracture seen. IMPRESSION: 1. Limited evaluation of right greater than left femoral neck due to positioning and obscuration of the neck by the trochanter. 2. No definite acute osseous abnormality is seen. Electronically Signed   By: Jasmine Pang M.D.   On: 11/18/2019 19:37   DG Chest Port 1 View  Result Date: 12/07/2019 CLINICAL DATA:  Fall EXAM: PORTABLE CHEST 1 VIEW COMPARISON:  05/15/2010 FINDINGS: Chronic elevation of left diaphragm. Mild reticular opacity at the right CP angle and base, likely scarring/fibrosis. Linear scarring or atelectasis left base. Mild cardiomegaly with aortic atherosclerosis. No consolidation, pleural effusion, or pneumothorax. IMPRESSION: 1. Cardiomegaly without edema or focal pulmonary opacity 2. Chronic elevation of left diaphragm. Fibrosis/scarring suspected at both bases. Electronically Signed   By: Jasmine Pang M.D.   On: 12/02/2019 19:36   DG Shoulder Right Portable  Result Date:  11/13/2019 CLINICAL DATA:  Larey Seat today with right shoulder pain. EXAM: PORTABLE RIGHT SHOULDER COMPARISON:  None. FINDINGS: The shoulder is held in relative internal rotation. The humeral head is properly located. Narrowed humeral acromial distance which could indicate rotator cuff pathology, either chronic or acute. AC joint unremarkable. Regional ribs are negative. IMPRESSION: No acute fracture or dislocation. Narrowed humeral acromial distance suggesting rotator cuff disease, age indeterminate. Electronically Signed   By: Paulina Fusi M.D.   On: 11/27/2019 22:18   ECHOCARDIOGRAM COMPLETE  Result Date: 12/04/2019    ECHOCARDIOGRAM REPORT   Patient Name:   Leonard Simpson Date of Exam: 12/04/2019 Medical Rec #:  [id number]        Height:       66.0 in Accession #:    [id number]       Weight:       130.0 lb Date of Birth:  Dec 22, 1923        BSA:          1.665 m Patient Age:    89 years         BP:           120/84 mmHg Patient Gender: M                HR:           75 bpm. Exam Location:  Inpatient Procedure: 2D Echo, Cardiac Doppler and Color Doppler Indications:    R94.31 Abnormal EKG  History:        Patient has prior history of Echocardiogram examinations, most                 recent 01/18/2011. LV dysfunction, CAD and Previous Myocardial                 Infarction, TIA, Signs/Symptoms:Altered Mental Status, Chest  Pain and Dyspnea; Risk Factors:Hypertension and Dyslipidemia.                 Rhabdomyolysis. Pneumonia.  Sonographer:    Sheralyn Boatman RDCS Referring Phys: [id number] [facility]  Sonographer Comments: Technically difficult study due to poor echo windows. Image acquisition challenging due to uncooperative patient. Patient has altered mental status. Very difficult study. Patient could not follow instructions and could not stop talking throughout exam. Patient could not keep arms down and out of the subcostal window. IMPRESSIONS  1. Left ventricular ejection fraction, by estimation, is  55 to 60%. The left ventricle has normal function. The left ventricle has no regional wall motion abnormalities. Left ventricular diastolic parameters are consistent with Grade I diastolic dysfunction (impaired relaxation). Elevated left ventricular end-diastolic pressure.  2. Right ventricular systolic function is normal. The right ventricular size is normal. Tricuspid regurgitation signal is inadequate for assessing PA pressure.  3. The mitral valve is normal in structure and function. Mild mitral valve regurgitation. No evidence of mitral stenosis.  4. The aortic valve was not well visualized. Severe aortic valve annular calcification. There is severe thickening of the aortic valve. There is severe calcifcation of the aortic valve. Aortic valve mean gradient measures 26.6 mmHg. Aortic valve peak gradient measures 47.2 mmHg. Aortic valve area, by VTI measures 0.74 cm. Aortic valve regurgitation is not visualized. Moderate to severe aortic valve stenosis.  5. The inferior vena cava is normal in size with greater than 50% respiratory variability, suggesting right atrial pressure of 3 mmHg.  6. Visually the AV apperas severly stenotic but images are of poor quality. The AVA is likely underestimated due to inaccurate LVOT measurement. The dimensionless index is 0.29 and mean gradient . SVI is reduced at 34. Findings c/w moderate to severe AS. 7. Consider repeat echo to reassess AV once patient is less combative.  FINDINGS  Left Ventricle: Left ventricular ejection fraction, by estimation, is 55 to 60%. The left ventricle has normal function. The left ventricle has no regional wall motion abnormalities. The left ventricular internal cavity size was normal in size. There is  no left ventricular hypertrophy. Left ventricular diastolic parameters are consistent with Grade I diastolic dysfunction (impaired relaxation). Elevated left ventricular end-diastolic pressure. Right Ventricle: The right ventricular size is  normal. No increase in right ventricular wall thickness. Right ventricular systolic function is normal. Tricuspid regurgitation signal is inadequate for assessing PA pressure. Left Atrium: Left atrial size was normal in size. Right Atrium: Right atrial size was normal in size. Pericardium: There is no evidence of pericardial effusion. Mitral Valve: The mitral valve is normal in structure and function. There is mild thickening of the anterior and posterior mitral valve leaflet(s). Normal mobility of the mitral valve leaflets. Mild mitral annular calcification. Mild mitral valve regurgitation. No evidence of mitral valve stenosis. Tricuspid Valve: The tricuspid valve is normal in structure. Tricuspid valve regurgitation is not demonstrated. No evidence of tricuspid stenosis. Aortic Valve: The aortic valve was not well visualized. . There is severe thickening and severe calcifcation of the aortic valve. Aortic valve regurgitation is not visualized. Moderate to severe aortic stenosis is present. Severe aortic valve annular calcification. There is severe thickening of the aortic valve. There is severe calcifcation of the aortic valve. Aortic valve mean gradient measures 30.0 mmHg. Aortic valve peak gradient measures 54.2 mmHg. Aortic valve area, by VTI measures 0.74 cm. Pulmonic Valve: The pulmonic valve was normal in structure. Pulmonic valve regurgitation is not visualized. No  evidence of pulmonic stenosis. Aorta: The aortic root is normal in size and structure. Venous: The inferior vena cava is normal in size with greater than 50% respiratory variability, suggesting right atrial pressure of 3 mmHg. IAS/Shunts: The interatrial septum appears to be lipomatous. No atrial level shunt detected by color flow Doppler.  LEFT VENTRICLE PLAX 2D LVIDd:         4.30 cm     Diastology LVIDs:         3.40 cm     LV e' lateral:   5.98 cm/s LV PW:         1.20 cm     LV E/e' lateral: 9.6 LV IVS:        1.00 cm     LV e' medial:     3.70 cm/s LVOT diam:     1.80 cm     LV E/e' medial:  15.6 LV SV:         56 LV SV Index:   34 LVOT Area:     2.54 cm  LV Volumes (MOD) LV vol d, MOD A2C: 50.0 ml LV vol d, MOD A4C: 87.1 ml LV vol s, MOD A2C: 24.3 ml LV vol s, MOD A4C: 38.5 ml LV SV MOD A2C:     25.7 ml LV SV MOD A4C:     87.1 ml LV SV MOD BP:      34.1 ml RIGHT VENTRICLE             IVC RV S prime:     13.30 cm/s  IVC diam: 1.50 cm TAPSE (M-mode): 2.1 cm LEFT ATRIUM             Index       RIGHT ATRIUM          Index LA diam:        3.20 cm 1.92 cm/m  RA Area:     8.52 cm LA Vol (A2C):   25.0 ml 15.01 ml/m RA Volume:   14.40 ml 8.65 ml/m LA Vol (A4C):   34.2 ml 20.54 ml/m LA Biplane Vol: 29.9 ml 17.96 ml/m  AORTIC VALVE AV Area (Vmax):    0.67 cm AV Area (Vmean):   0.73 cm AV Area (VTI):     0.74 cm AV Vmax:           368.00 cm/s AV Vmean:          238.800 cm/s AV VTI:            0.761 m AV Peak Grad:      54.2 mmHg AV Mean Grad:      30.0 mmHg LVOT Vmax:         97.00 cm/s LVOT Vmean:        68.600 cm/s LVOT VTI:          0.222 m LVOT/AV VTI ratio: 0.29  AORTA Ao Root diam: 3.00 cm MITRAL VALVE MV Area (PHT): 3.77 cm    SHUNTS MV Decel Time: 201 msec    Systemic VTI:  0.22 m MV E velocity: 57.70 cm/s  Systemic Diam: 1.80 cm MV A velocity: 84.40 cm/s MV E/A ratio:  0.68 Armanda Magic MD Electronically signed by Armanda Magic MD Signature Date/Time: 12/04/2019/12:34:16 PM    Final    CT Maxillofacial Wo Contrast  Result Date: 12/05/2019 CLINICAL DATA:  Trauma. EXAM: CT HEAD WITHOUT CONTRAST CT MAXILLOFACIAL WITHOUT CONTRAST CT CERVICAL SPINE WITHOUT CONTRAST CT CHEST, ABDOMEN AND PELVIS WITHOUT CONTRAST TECHNIQUE:  Contiguous axial images were obtained from the base of the skull through the vertex without intravenous contrast. Multidetector CT imaging of the maxillofacial structures was performed. Multiplanar CT image reconstructions were also generated. A small metallic BB was placed on the right temple in order to reliably differentiate  right from left. Multidetector CT imaging of the cervical spine was performed without intravenous contrast. Multiplanar CT image reconstructions were also generated. Multidetector CT imaging of the chest, abdomen and pelvis was performed following the standard protocol without IV contrast. COMPARISON:  None. FINDINGS: CT HEAD FINDINGS Brain: No evidence of acute infarction, hemorrhage, hydrocephalus, extra-axial collection or mass lesion/mass effect. There is extensive volume loss in addition to extensive chronic microvascular ischemic changes. 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 Vascular: No hyperdense vessel or unexpected calcification. Skull: Normal. Negative for fracture or focal lesion. Other: None. CT MAXILLOFACIAL FINDINGS Osseous: The evaluation of the osseous structures is significantly limited by motion artifact. Given this limitation, there is no definite acute osseous abnormality. Orbits: Negative. No traumatic or inflammatory finding. Sinuses: Clear. Soft tissues: Negative. CT CERVICAL SPINE FINDINGS Alignment: Normal. Skull base and vertebrae: No acute fracture. No primary bone lesion or focal pathologic process. Soft tissues and spinal canal: No prevertebral fluid or swelling. No visible canal hematoma. Disc levels: Multilevel degenerative changes are noted throughout the cervical spine, greatest at the C5-C6 and C6-C7 levels. Other: None. CT CHEST FINDINGS Cardiovascular: Heart size is normal. Advanced atherosclerotic changes are noted of the thoracic aorta and coronary arteries. There is no significant pericardial effusion. Mediastinum/Nodes: --No mediastinal or hilar lymphadenopathy. --No axillary lymphadenopathy. --No supraclavicular lymphadenopathy. --Normal thyroid gland. --The esophagus is unremarkable Lungs/Pleura: There are areas of honeycombing involving the lung bases and lung apices. There are coarse reticular lung markings bilaterally. There is a somewhat masslike  airspace opacity involving the right upper lobe measuring approximately 1.8 x 2.2 cm (axial series 5, image 57). The trachea is unremarkable. There is no pneumothorax or significant pleural effusion. The left hemidiaphragm is elevated. Musculoskeletal: No chest wall abnormality. No acute or significant osseous findings. CT ABDOMEN AND PELVIS FINDINGS Hepatobiliary: The liver is normal. Cholelithiasis without acute inflammation.The common bile duct is dilated measuring up to approximately 1.4 cm. Pancreas: Normal contours without ductal dilatation. No peripancreatic fluid collection. Spleen: No splenic laceration or hematoma. Adrenals/Urinary Tract: --Adrenal glands: No adrenal hemorrhage. --Right kidney/ureter: No hydronephrosis or perinephric hematoma. --Left kidney/ureter: No hydronephrosis or perinephric hematoma. --Urinary bladder: The bladder is decompressed with a Foley catheter. Stomach/Bowel: --Stomach/Duodenum: No hiatal hernia or other gastric abnormality. Normal duodenal course and caliber. --Small bowel: No dilatation or inflammation. --Colon: There is a large amount of well-formed stool at the level of the rectum. There is a moderate amount of stool throughout the remaining portions of the colon. --Appendix: Not visualized. No right lower quadrant inflammation or free fluid. Vascular/Lymphatic: Advanced vascular calcifications are noted. The infrarenal abdominal aorta measures up to approximately 2.8 cm in diameter. --No retroperitoneal lymphadenopathy. --No mesenteric lymphadenopathy. --No pelvic or inguinal lymphadenopathy. Reproductive: The prostate gland is enlarged. Other: No ascites or free air. There is mild body wall edema. Musculoskeletal. No acute displaced fractures. IMPRESSION: 1. No acute intracranial abnormality. Advanced atrophy and chronic microvascular ischemic changes are noted. 2. No acute cervical spine fracture. 3. No acute facial bone fracture, however evaluation was limited by  significant motion artifact. 4. Multiple lung findings are noted as detailed above with differential considerations fibrotic lung disease with a superimposed atypical infectious process  such as viral pneumonia not excluded. 5. There is a masslike airspace opacity in the right upper lobe as detailed above. This may represent an area of consolidation, however an underlying mass is not excluded. A three-month follow-up CT of the chest is recommended. 6. Ectatic abdominal aorta at risk for aneurysm development, currently measuring 2.8 cm. Recommend followup by ultrasound in 5 years. This recommendation follows ACR consensus guidelines: White Paper of the ACR Incidental Findings Committee II on Vascular Findings. J Am Coll Radiol 2013; 10:789-794. Aortic aneurysm NOS (ICD10-I71.9) 7. Cholelithiasis with dilated common bile duct measuring up to 1.4 cm. No CT evidence of acute cholecystitis. If there is clinical concern for acute biliary pathology, recommend further evaluation with MRCP with the patient is clinically stable. An emergent MRCP would likely be degraded by significant motion artifact. 8. Large amount of well-formed stool at the level of the rectum. This may represent fecal impaction. 9. Aortic Atherosclerosis (ICD10-I70.0). Electronically Signed   By: Katherine Mantle M.D.   On: 11/13/2019 21:17   US Abdomen Limited RUQ  Result Date: 12/04/2019 CLINICAL DATA:  Elevated LFTs, altered mental status EXAM: ULTRASOUND ABDOMEN LIMITED RIGHT UPPER QUADRANT COMPARISON:  CT abdomen pelvis 11/27/2019 FINDINGS: Gallbladder: Echogenic, shadowing gallstone noted in the neck of the gallbladder measuring up to 1.4 cm in maximum diameter. No gallbladder wall thickening or pericholecystic fluid. Sonographic Eulah Pont sign is reportedly negative. Common bile duct: Diameter: 12 mm, dilated.  No visible intraductal gallstones. Liver: No focal lesion identified. Parenchymal echogenicity at the upper limits of normal. Portal  vein is patent on color Doppler imaging with normal direction of blood flow towards the liver. Other: None. IMPRESSION: Cholelithiasis without evidence of acute cholecystitis. Extrahepatic biliary ductal dilatation without visible stone. Distal duct is poorly assessed. If concern for choledocholithiasis, correlate with serologies and consider MRCP for further evaluation. Electronically Signed   By: Kreg Shropshire M.D.   On: 12/04/2019 03:54     Scheduled Meds: . bisacodyl  10 mg Rectal Once  . Chlorhexidine Gluconate Cloth  6 each Topical Daily  . collagenase   Topical Daily  . heparin  5,000 Units Subcutaneous Q8H  . OLANZapine zydis  5 mg Oral QHS  . pantoprazole  20 mg Oral Daily  . polyethylene glycol  17 g Oral Daily  . tamsulosin  0.4 mg Oral Daily   Continuous Infusions: . ceFEPime (MAXIPIME) IV Stopped (12/04/19 2205)  . lactated ringers 125 mL/hr at 12/05/19 0544  . vancomycin Stopped (12/04/19 0135)     LOS: 2 days    Time spent: 30 minutes   Sharlene Dory, DO [facility] 12/05/2019, 1:53 PM   Available via Epic secure chat 7am-7pm After these hours, please refer to coverage provider listed on [email or web address]

## 2019-12-05 NOTE — Consult Note (Addendum)
WOC Nurse Consult Note: Reason for Consult: Consult requested for multiple wounds.  Pt was found down for several days prior to admission and has multiple unstageable wounds.  Wound type: Right anterior hip 100% loose yellow slough.  Mod amt yellow drainage.  3X5cm, surrounded by approx 10X10cm area of erythemia and raised skin which is fluctuant when palpated to posterior flank.  There might be a hematoma or abscess underneath the skin level, consider X-ray to R/O fluid collection.  Conservative sharp debridement performed using scissors, scalpel, and forceps. Removed 50% yellow slough, remaining 50% is tightly adhered to inner wound.  Pt tolerated with minimal amt pain and no bleeding. Wound depth 1 cm after debridement.  Left elbow with partial thickness abrasion: 3X2X1.cm, pink and moist.  Foam dressing applied to promote healing.  All the following wounds are unstageable pressure injuries with 100% tightly adhered eschar, no odor, drainage, or fluctuance. Right elbow 6X3cm Left knee 10X5cm Right knee 10X4cm Left foot .3X.5cm, also 2X2cm   Pressure Injury POA: Yes Dressing procedure/placement/frequency: Topical treatment orders provided for bedside nurses to perform as follows to assist with removal of nonviable tissue: Apply Santyl to right anterior hip wound Q day, then cover with moist gauze packing and foam dressing.  (Change foam dressing Q 3 days or PRN soiling.) Change dressings to left foot, bilat knees, and right elbow Q day as follows:  Apply double-folded xeroform gauze to wounds, then cover with gauze and kerlex to hold in place. Please re-consult if further assistance is needed.  Thank-you,  Cammie Mcgee MSN, RN, CWOCN, [place], CNS [phone]

## 2019-12-06 ENCOUNTER — Inpatient Hospital Stay (HOSPITAL_COMMUNITY): Payer: Medicare Other

## 2019-12-06 DIAGNOSIS — R778 Other specified abnormalities of plasma proteins: Secondary | ICD-10-CM

## 2019-12-06 DIAGNOSIS — G9341 Metabolic encephalopathy: Secondary | ICD-10-CM

## 2019-12-06 DIAGNOSIS — N179 Acute kidney failure, unspecified: Secondary | ICD-10-CM

## 2019-12-06 LAB — COMPREHENSIVE METABOLIC PANEL
ALT: 39 U/L (ref 0–44)
AST: 58 U/L — ABNORMAL HIGH (ref 15–41)
Albumin: 2.2 g/dL — ABNORMAL LOW (ref 3.5–5.0)
Alkaline Phosphatase: 41 U/L (ref 38–126)
Anion gap: 9 (ref 5–15)
BUN: 36 mg/dL — ABNORMAL HIGH (ref 8–23)
CO2: 22 mmol/L (ref 22–32)
Calcium: 8.2 mg/dL — ABNORMAL LOW (ref 8.9–10.3)
Chloride: 108 mmol/L (ref 98–111)
Creatinine, Ser: 1.57 mg/dL — ABNORMAL HIGH (ref 0.61–1.24)
GFR calc Af Amer: 43 mL/min — ABNORMAL LOW (ref >60)
GFR calc non Af Amer: 37 mL/min — ABNORMAL LOW (ref >60)
Glucose, Bld: 85 mg/dL (ref 70–99)
Potassium: 4.2 mmol/L (ref 3.5–5.1)
Sodium: 139 mmol/L (ref 135–145)
Total Bilirubin: 1.3 mg/dL — ABNORMAL HIGH (ref 0.3–1.2)
Total Protein: 5.2 g/dL — ABNORMAL LOW (ref 6.5–8.1)

## 2019-12-06 LAB — CBC
HCT: 33.1 % — ABNORMAL LOW (ref 39.0–52.0)
Hemoglobin: 10.7 g/dL — ABNORMAL LOW (ref 13.0–17.0)
MCH: 32.1 pg (ref 26.0–34.0)
MCHC: 32.3 g/dL (ref 30.0–36.0)
MCV: 99.4 fL (ref 80.0–100.0)
Platelets: 99 10*3/uL — ABNORMAL LOW (ref 150–400)
RBC: 3.33 MIL/uL — ABNORMAL LOW (ref 4.22–5.81)
RDW: 14.9 % (ref 11.5–15.5)
WBC: 10.1 10*3/uL (ref 4.0–10.5)
nRBC: 0 % (ref 0.0–0.2)

## 2019-12-06 LAB — CK: Total CK: 378 U/L (ref 49–397)

## 2019-12-06 MED ORDER — SENNA 8.6 MG PO TABS
1.0000 | ORAL_TABLET | Freq: Every day | ORAL | Status: DC
  Filled 2019-12-06 (×2): qty 1

## 2019-12-06 MED ORDER — DOCUSATE SODIUM 100 MG PO CAPS
100.0000 mg | ORAL_CAPSULE | Freq: Two times a day (BID) | ORAL | Status: DC
  Administered 2019-12-07: 100 mg via ORAL
  Filled 2019-12-06 (×4): qty 1

## 2019-12-06 MED ORDER — SODIUM CHLORIDE 0.9 % IV SOLN: INTRAVENOUS | Status: DC

## 2019-12-06 MED ORDER — MAGNESIUM SULFATE 2 GM/50ML IV SOLN
2.0000 g | Freq: Once | INTRAVENOUS | Status: AC
  Administered 2019-12-06: 2 g via INTRAVENOUS
  Filled 2019-12-06: qty 50

## 2019-12-06 MED ORDER — LORAZEPAM 2 MG/ML IJ SOLN
2.0000 mg | Freq: Once | INTRAMUSCULAR | Status: AC
  Administered 2019-12-06: 2 mg via INTRAVENOUS
  Filled 2019-12-06: qty 1

## 2019-12-06 NOTE — Progress Notes (Signed)
Haldol help relax patient at first but starting to get more restless.

## 2019-12-06 NOTE — Progress Notes (Signed)
Patient is very confused and trying to get out of bed. Patient lying horizontally in bed . Yelling at times. Bed alarm going off. H.R. up S.T. 120. R.N. aware. Also patient is to go for C.T. scan of abdo. Bodenheimer N.P. called and made aware and order for soft posey belt and bilat. soft wrist restrains. And to give Ativan 2 mg I.V. on call to x-ray.

## 2019-12-06 NOTE — Progress Notes (Signed)
PROGRESS NOTE    Leonard Simpson  DPO:[id number] DOB: 1924-06-29 DOA: Dec 07, 2019 PCP: [facility]     Brief Narrative:  Patient is a 86 year old male with a medical history significant for CAD, hypertension, hyperlipidemia, BPH, GERD, and what sounds like dementia.  Patient presented with altered mental status and a fall on 2019-12-07.  Patient was found by son on the floor and noted to have multiple sores all over his body.  He was confused/disoriented on presentation.  His son helps him with ADLs.  He was at his baseline 2 days prior to admission when he was last seen.  CT chest concerning for possible pneumonia, Covid testing negative.  Treatment for rhabdomyolysis with fluids was initiated in addition to vancomycin and cefepime for pneumonia.  12/06/2019: Patient seen.  Patient is resting quietly.  Patient is also restricted.  CT scan of the abdomen and pelvis revealed fecal impaction and cholelithiasis.  Will start patient on senna and Colace.  Rhabdomyolysis has resolved, will decrease IV fluids to 75 cc/h normal saline.  Acute kidney injury is resolving slowly.  We will also try soap suds enema.  Updated patient's son, Tymothy.  Assessment & Plan:   Principal Problem:   Rhabdomyolysis  CK improving  Continue LR 125 mL/hour  Continue to hold statin  Monitor renal panel and hep function panel  12/06/2019: Rhabdomyolysis has resolved.  Acute kidney injury is slowly resolving.   Active Problems:   Sepsis (HCC)  Sepsis syndrome has resolved  Completed treatment for pneumonia.    Patient is currently on IV vancomycin and cefepime.        Coronary artery disease/elevated troponin:  See documentation on admission (H&P).    As per H&P's documentation, this was discussed with cardiology team   Echocardiogram revealed moderate to severe aortic stenosis.  Echo is of poor quality.  Repeat echocardiogram once patient is less combative.     Hypertension  Normotensive, continue to  hold home medications    Hypercholesterolemia  Hold statin    GERD (gastroesophageal reflux disease)  Protonix 20 mg daily    Elevated LFTs  Improving  2/2 rhabdo    AKI (acute kidney injury) (HCC)  Improving.   Continue to hold nephrotoxins  Decrease IV fluid to 75 cc/h.  Continue treatment renal function and electrolytes.      Pneumonia  Continue vancomycin and cefepime  Complete course of antibiotics.  Decrease IV fluids.    Acute metabolic encephalopathy  Patient is resting quietly for now.    Fall  PT/OT    Elevated troponin I level  This was discussed with cardiology.    BPH (benign prostatic hyperplasia)  Flomax 0.4 mg daily    Dementia (HCC)  Add Zyprexa 5 mg nightly, I think this will help [place] avoid benzos    Wound of abdomen  Appreciate wound care team  CT abdomen and pelvis result is as documented above.    Overall, patient's prognosis is guarded.  Consult palliative care team.  DVT prophylaxis: Heparin Code Status: DNR Family Communication: Son, Kaiyan updated today Coming From: Home Disposition Plan: SNF.  This will also depend on hospital course.  Prognosis is guarded. Barriers to Discharge: Approval, clinical improvement    Antimicrobials:  Anti-infectives (From admission, onward)   Start     Dose/Rate Route Frequency Ordered Stop   12/04/19 2200  ceFEPIme (MAXIPIME) 2 g in sodium chloride 0.9 % 100 mL IVPB     2 g 200 mL/hr over 30 Minutes  Intravenous Every 24 hours 12/04/19 0048     12/04/2019 2245  vancomycin (VANCOCIN) IVPB 1000 mg/200 mL premix     1,000 mg 200 mL/hr over 60 Minutes Intravenous Every 48 hours 11/11/2019 2230     11/21/2019 2215  ceFEPIme (MAXIPIME) 2 g in sodium chloride 0.9 % 100 mL IVPB     2 g 200 mL/hr over 30 Minutes Intravenous  Once 11/13/2019 2207 12/04/19 0258       Objective: Vitals:   12/05/19 2223 12/06/19 0103 12/06/19 0536 12/06/19 0756  BP: (!) 105/91 128/82 (!) 121/56 109/61  Pulse: 86   77  Resp: 19  18 18 17   Temp:   (!) 97.5 F (36.4 C) 97.8 F (36.6 C)  TempSrc:   Axillary Axillary  SpO2:  100% 100% 100%  Weight:      Height:        Intake/Output Summary (Last 24 hours) at 12/06/2019 1119 Last data filed at 12/06/2019 0610 Gross per 24 hour  Intake 1470 ml  Output 1950 ml  Net -480 ml   Filed Weights   11/22/2019 1850  Weight: 59 kg    Examination:  General exam: Resting quietly (likely from effect of medication).   Respiratory system: Clear to auscultation.  Cardiovascular system: S1 & S2 heard, RRR.  Gastrointestinal system: Abdomen is nondistended, soft and nontender. Normal bowel sounds heard. Central nervous system: Could not assess as patient is resting quietly.  Patient is also restricted.   Extremities: No leg edema.  Data Reviewed: I have personally reviewed following labs and imaging studies  CBC: Recent Labs  Lab 11/20/2019 1905 11/26/2019 1955 12/04/19 0353 12/05/19 0704 12/06/19 0858  WBC  --  20.6* 15.7* 9.6 10.1  HGB 15.6 13.1 11.6* 10.9* 10.7*  HCT 46.0 42.4 35.6* 33.4* 33.1*  MCV  --  102.4* 98.1 97.9 99.4  PLT  --  108* 112* 96* 99*   Basic Metabolic Panel: Recent Labs  Lab 11/20/2019 1905 12/02/2019 1955 12/04/19 0103 12/04/19 0353 12/05/19 0704 12/06/19 0858  NA 141 141  --  142 143 139  K 5.1 5.9*  --  3.9 3.7 4.2  CL 110 109  --  110 109 108  CO2  --  21*  --  22 24 22   GLUCOSE 91 89  --  119* 81 85  BUN 61* 42*  --  45* 45* 36*  CREATININE 2.10* 1.98*  --  1.92* 1.91* 1.57*  CALCIUM  --  8.3*  --  7.7* 8.5* 8.2*  MG  --   --  1.9  --   --   --    GFR: Estimated Creatinine Clearance: 24.5 mL/min (A) (by C-G formula based on SCr of 1.57 mg/dL (H)). Liver Function Tests: Recent Labs  Lab 11/19/2019 1955 12/04/19 0353 12/05/19 0704 12/06/19 0858  AST 152* 115* 79* 58*  ALT 51* 40 41 39  ALKPHOS 52 43 48 41  BILITOT 1.7* 0.9 1.1 1.3*  PROT 5.9* 5.4* 5.4* 5.2*  ALBUMIN 2.8* 2.4* 2.3* 2.2*   Recent Labs  Lab 12/04/19 0106    AMMONIA 32   Coagulation Profile: Recent Labs  Lab 11/20/2019 2011  INR 1.1   Cardiac Enzymes: Recent Labs  Lab 12/05/2019 1955 12/04/19 0353 12/05/19 0704 12/06/19 0858  CKTOTAL 3,222* 2,231* 940* 378   CBG: Recent Labs  Lab 11/27/2019 1846  GLUCAP 83   Thyroid Function Tests: Recent Labs    12/04/19 0106  TSH 4.041   Anemia Panel:  Recent Labs    12/04/19 0106  VITAMINB12 400   Sepsis Labs: Recent Labs  Lab 11/15/2019 2011 11/17/2019 2242 12/04/19 0353  PROCALCITON  --   --  0.61  LATICACIDVEN 4.5* 1.7  --     Recent Results (from the past 240 hour(s))  Blood culture (routine x 2)     Status: None (Preliminary result)   Collection Time: 11/25/2019 10:42 PM   Specimen: BLOOD  Result Value Ref Range Status   Specimen Description BLOOD LEFT HAND  Final   Special Requests   Final    BOTTLES DRAWN AEROBIC ONLY Blood Culture results may not be optimal due to an inadequate volume of blood received in culture bottles   Culture   Final    NO GROWTH 3 DAYS Performed at [facility], [address] [place], [place] [address]    Report Status PENDING  Incomplete  Blood culture (routine x 2)     Status: None (Preliminary result)   Collection Time: 11/19/2019 10:42 PM   Specimen: BLOOD  Result Value Ref Range Status   Specimen Description BLOOD LEFT ARM  Final   Special Requests   Final    BOTTLES DRAWN AEROBIC ONLY Blood Culture results may not be optimal due to an inadequate volume of blood received in culture bottles   Culture   Final    NO GROWTH 3 DAYS Performed at [facility], [address] [place], [place] [address]    Report Status PENDING  Incomplete  SARS CORONAVIRUS 2 (TAT 6-24 HRS) Nasopharyngeal Nasopharyngeal Swab     Status: None   Collection Time: 12/04/19  1:00 AM   Specimen: Nasopharyngeal Swab  Result Value Ref Range Status   SARS Coronavirus 2 NEGATIVE NEGATIVE Final    Comment: (NOTE) SARS-CoV-2 target nucleic acids are NOT  DETECTED. The SARS-CoV-2 RNA is generally detectable in upper and lower respiratory specimens during the acute phase of infection. Negative results do not preclude SARS-CoV-2 infection, do not rule out co-infections with other pathogens, and should not be used as the sole basis for treatment or other patient management decisions. Negative results must be combined with clinical observations, patient history, and epidemiological information. The expected result is Negative. Fact Sheet for Patients: HairSlick.no Fact Sheet for Healthcare Providers: quierodirigir.com This test is not yet approved or cleared by the [place] FDA and  has been authorized for detection and/or diagnosis of SARS-CoV-2 by FDA under an Emergency Use Authorization (EUA). This EUA will remain  in effect (meaning this test can be used) for the duration of the COVID-19 declaration under Section 56 4(b)(1) of the Act, 21 U.S.C. section 360bbb-3(b)(1), unless the authorization is terminated or revoked sooner. Performed at [facility], [address] [place], [place] [address]       Radiology Studies: CT ABDOMEN PELVIS WO CONTRAST  Result Date: 12/06/2019 CLINICAL DATA:  Cholelithiasis, history of trauma, concern for abdominal abscess EXAM: CT ABDOMEN AND PELVIS WITHOUT CONTRAST TECHNIQUE: Multidetector CT imaging of the abdomen and pelvis was performed following the standard protocol without IV contrast. COMPARISON:  11/15/2019 FINDINGS: This evaluation is limited by patient respiratory motion throughout the examination. Lower chest: Diffuse bibasilar scarring and fibrosis. Interval development of small bilateral pleural effusions and patchy bibasilar atelectasis. Hepatobiliary: Gallstones are less well visualized due to respiratory motion. No evidence of cholecystitis. Liver remains unremarkable. Pancreas: No gross pancreatic abnormality. Spleen: No gross  splenic abnormality. Adrenals/Urinary Tract: No urinary tract calculi  or obstructive uropathy. Bladder is decompressed with a Foley catheter. The adrenals are unremarkable. Stomach/Bowel: Fecal impaction within the rectal vault unchanged. No bowel obstruction or ileus. Vascular/Lymphatic: Severe atherosclerosis of the abdominal aorta. No pathologic adenopathy. Reproductive: Stable enlarged prostate. Other: Subcutaneous edema in the bilateral flanks again noted. No free fluid within the abdomen or pelvis. No free intra-abdominal gas. Musculoskeletal: No acute displaced fractures. Reconstructed images demonstrate no additional findings. IMPRESSION: 1. Limited study due to patient motion throughout the exam. 2. Fecal impaction. 3. Small bilateral pleural effusions with bibasilar atelectasis. 4. Cholelithiasis without cholecystitis. 5. Severe atherosclerosis. Electronically Signed   By: Randa Ngo M.D.   On: 12/06/2019 01:09   ECHOCARDIOGRAM COMPLETE  Result Date: 12/04/2019    ECHOCARDIOGRAM REPORT   Patient Name:   MORLEY GAUMER Date of Exam: 12/04/2019 Medical Rec #:  [id number]        Height:       66.0 in Accession #:    [id number]       Weight:       130.0 lb Date of Birth:  [birth year]        BSA:          1.665 m Patient Age:    87 years         BP:           120/84 mmHg Patient Gender: M                HR:           75 bpm. Exam Location:  Inpatient Procedure: 2D Echo, Cardiac Doppler and Color Doppler Indications:    R94.31 Abnormal EKG  History:        Patient has prior history of Echocardiogram examinations, most                 recent 01/18/2011. LV dysfunction, CAD and Previous Myocardial                 Infarction, TIA, Signs/Symptoms:Altered Mental Status, Chest                 Pain and Dyspnea; Risk Factors:Hypertension and Dyslipidemia.                 Rhabdomyolysis. Pneumonia.  Sonographer:    Roseanna Rainbow RDCS Referring Phys: [id number] [facility]  Sonographer Comments: Technically difficult  study due to poor echo windows. Image acquisition challenging due to uncooperative patient. Patient has altered mental status. Very difficult study. Patient could not follow instructions and could not stop talking throughout exam. Patient could not keep arms down and out of the subcostal window. IMPRESSIONS  1. Left ventricular ejection fraction, by estimation, is 55 to 60%. The left ventricle has normal function. The left ventricle has no regional wall motion abnormalities. Left ventricular diastolic parameters are consistent with Grade I diastolic dysfunction (impaired relaxation). Elevated left ventricular end-diastolic pressure.  2. Right ventricular systolic function is normal. The right ventricular size is normal. Tricuspid regurgitation signal is inadequate for assessing PA pressure.  3. The mitral valve is normal in structure and function. Mild mitral valve regurgitation. No evidence of mitral stenosis.  4. The aortic valve was not well visualized. Severe aortic valve annular calcification. There is severe thickening of the aortic valve. There is severe calcifcation of the aortic valve. Aortic valve mean gradient measures 26.6 mmHg. Aortic valve peak gradient measures 47.2 mmHg. Aortic valve area, by VTI measures 0.74 cm. Aortic valve regurgitation is not visualized.  Moderate to severe aortic valve stenosis.  5. The inferior vena cava is normal in size with greater than 50% respiratory variability, suggesting right atrial pressure of 3 mmHg.  6. Visually the AV apperas severly stenotic but images are of poor quality. The AVA is likely underestimated due to inaccurate LVOT measurement. The dimensionless index is 0.29 and mean gradient . SVI is reduced at 34. Findings c/w moderate to severe AS. 7. Consider repeat echo to reassess AV once patient is less combative.  FINDINGS  Left Ventricle: Left ventricular ejection fraction, by estimation, is 55 to 60%. The left ventricle has normal function. The left  ventricle has no regional wall motion abnormalities. The left ventricular internal cavity size was normal in size. There is  no left ventricular hypertrophy. Left ventricular diastolic parameters are consistent with Grade I diastolic dysfunction (impaired relaxation). Elevated left ventricular end-diastolic pressure. Right Ventricle: The right ventricular size is normal. No increase in right ventricular wall thickness. Right ventricular systolic function is normal. Tricuspid regurgitation signal is inadequate for assessing PA pressure. Left Atrium: Left atrial size was normal in size. Right Atrium: Right atrial size was normal in size. Pericardium: There is no evidence of pericardial effusion. Mitral Valve: The mitral valve is normal in structure and function. There is mild thickening of the anterior and posterior mitral valve leaflet(s). Normal mobility of the mitral valve leaflets. Mild mitral annular calcification. Mild mitral valve regurgitation. No evidence of mitral valve stenosis. Tricuspid Valve: The tricuspid valve is normal in structure. Tricuspid valve regurgitation is not demonstrated. No evidence of tricuspid stenosis. Aortic Valve: The aortic valve was not well visualized. . There is severe thickening and severe calcifcation of the aortic valve. Aortic valve regurgitation is not visualized. Moderate to severe aortic stenosis is present. Severe aortic valve annular calcification. There is severe thickening of the aortic valve. There is severe calcifcation of the aortic valve. Aortic valve mean gradient measures 30.0 mmHg. Aortic valve peak gradient measures 54.2 mmHg. Aortic valve area, by VTI measures 0.74 cm. Pulmonic Valve: The pulmonic valve was normal in structure. Pulmonic valve regurgitation is not visualized. No evidence of pulmonic stenosis. Aorta: The aortic root is normal in size and structure. Venous: The inferior vena cava is normal in size with greater than 50% respiratory variability,  suggesting right atrial pressure of 3 mmHg. IAS/Shunts: The interatrial septum appears to be lipomatous. No atrial level shunt detected by color flow Doppler.  LEFT VENTRICLE PLAX 2D LVIDd:         4.30 cm     Diastology LVIDs:         3.40 cm     LV e' lateral:   5.98 cm/s LV PW:         1.20 cm     LV E/e' lateral: 9.6 LV IVS:        1.00 cm     LV e' medial:    3.70 cm/s LVOT diam:     1.80 cm     LV E/e' medial:  15.6 LV SV:         56 LV SV Index:   34 LVOT Area:     2.54 cm  LV Volumes (MOD) LV vol d, MOD A2C: 50.0 ml LV vol d, MOD A4C: 87.1 ml LV vol s, MOD A2C: 24.3 ml LV vol s, MOD A4C: 38.5 ml LV SV MOD A2C:     25.7 ml LV SV MOD A4C:     87.1 ml LV SV  MOD BP:      34.1 ml RIGHT VENTRICLE             IVC RV S prime:     13.30 cm/s  IVC diam: 1.50 cm TAPSE (M-mode): 2.1 cm LEFT ATRIUM             Index       RIGHT ATRIUM          Index LA diam:        3.20 cm 1.92 cm/m  RA Area:     8.52 cm LA Vol (A2C):   25.0 ml 15.01 ml/m RA Volume:   14.40 ml 8.65 ml/m LA Vol (A4C):   34.2 ml 20.54 ml/m LA Biplane Vol: 29.9 ml 17.96 ml/m  AORTIC VALVE AV Area (Vmax):    0.67 cm AV Area (Vmean):   0.73 cm AV Area (VTI):     0.74 cm AV Vmax:           368.00 cm/s AV Vmean:          238.800 cm/s AV VTI:            0.761 m AV Peak Grad:      54.2 mmHg AV Mean Grad:      30.0 mmHg LVOT Vmax:         97.00 cm/s LVOT Vmean:        68.600 cm/s LVOT VTI:          0.222 m LVOT/AV VTI ratio: 0.29  AORTA Ao Root diam: 3.00 cm MITRAL VALVE MV Area (PHT): 3.77 cm    SHUNTS MV Decel Time: 201 msec    Systemic VTI:  0.22 m MV E velocity: 57.70 cm/s  Systemic Diam: 1.80 cm MV A velocity: 84.40 cm/s MV E/A ratio:  0.68 Armanda Magicraci Turner MD Electronically signed by Armanda Magicraci Turner MD Signature Date/Time: 12/04/2019/12:34:16 PM    Final      Scheduled Meds: . bisacodyl  10 mg Rectal Once  . Chlorhexidine Gluconate Cloth  6 each Topical Daily  . collagenase   Topical Daily  . heparin  5,000 Units Subcutaneous Q8H  . OLANZapine  zydis  5 mg Oral QHS  . pantoprazole  20 mg Oral Daily  . polyethylene glycol  17 g Oral Daily  . tamsulosin  0.4 mg Oral Daily   Continuous Infusions: . ceFEPime (MAXIPIME) IV Stopped (12/05/19 2156)  . lactated ringers 125 mL/hr at 12/06/19 0554  . vancomycin Stopped (12/05/19 2312)     LOS: 3 days    Time spent: 35 minutes   Barnetta ChapelSylvester I Robina Hamor, MD [facility] 12/06/2019, 11:19 AM   Available via Epic secure chat 7am-7pm After these hours, please refer to coverage provider listed on [email or web address]

## 2019-12-06 NOTE — Progress Notes (Signed)
Call was returned. Haldol 5 mg I.V. was order by Bodenheimer N.P. and given.

## 2019-12-06 NOTE — Progress Notes (Signed)
Attempted to call son Braylee at [phone] x 2  Got busy signal both times. Was calling to inform him we had to put his father in restrains.

## 2019-12-06 NOTE — TOC Progression Note (Signed)
Transition of Care [facility]) - Progression Note    Patient Details  Name: Leonard Simpson MRN: [id number] Date of Birth: 09-Dec-1923  Transition of [facility]) CM/SW Contact  Terrilee Croak, Student-Social Work Phone Number: 12/06/2019, 10:05 AM  Clinical Narrative:     MSW Intern attempted to contact pt's son to discuss discharge placement. Number was busy for multiple tries. A mesage was left for the other contact number in the chart, Corrie Dandy, spouse. SW will continue to follow.  Expected Discharge Plan: Skilled Nursing Facility Barriers to Discharge: Continued Medical Work up, Requiring sitter/restraints  Expected Discharge Plan and Services Expected Discharge Plan: Skilled Nursing Facility       Living arrangements for the past 2 months: Single Family Home                                       Social Determinants of Health (SDOH) Interventions    Readmission Risk Interventions No flowsheet data found.

## 2019-12-06 NOTE — Progress Notes (Signed)
Pt's son Duante at bedside. He was updated on pt being in restraints due to events this am. Daden is wanting an update from MD. MD notified.

## 2019-12-07 ENCOUNTER — Inpatient Hospital Stay (HOSPITAL_COMMUNITY): Payer: Medicare Other

## 2019-12-07 DIAGNOSIS — T796XXA Traumatic ischemia of muscle, initial encounter: Secondary | ICD-10-CM

## 2019-12-07 DIAGNOSIS — Z515 Encounter for palliative care: Secondary | ICD-10-CM

## 2019-12-07 DIAGNOSIS — K5641 Fecal impaction: Secondary | ICD-10-CM

## 2019-12-07 LAB — CK: Total CK: 288 U/L (ref 49–397)

## 2019-12-07 LAB — CBC
HCT: 36.9 % — ABNORMAL LOW (ref 39.0–52.0)
Hemoglobin: 11.9 g/dL — ABNORMAL LOW (ref 13.0–17.0)
MCH: 31.5 pg (ref 26.0–34.0)
MCHC: 32.2 g/dL (ref 30.0–36.0)
MCV: 97.6 fL (ref 80.0–100.0)
Platelets: 130 10*3/uL — ABNORMAL LOW (ref 150–400)
RBC: 3.78 MIL/uL — ABNORMAL LOW (ref 4.22–5.81)
RDW: 14.6 % (ref 11.5–15.5)
WBC: 10.5 10*3/uL (ref 4.0–10.5)
nRBC: 0 % (ref 0.0–0.2)

## 2019-12-07 LAB — COMPREHENSIVE METABOLIC PANEL
ALT: 41 U/L (ref 0–44)
AST: 55 U/L — ABNORMAL HIGH (ref 15–41)
Albumin: 2.5 g/dL — ABNORMAL LOW (ref 3.5–5.0)
Alkaline Phosphatase: 52 U/L (ref 38–126)
Anion gap: 13 (ref 5–15)
BUN: 28 mg/dL — ABNORMAL HIGH (ref 8–23)
CO2: 22 mmol/L (ref 22–32)
Calcium: 8.5 mg/dL — ABNORMAL LOW (ref 8.9–10.3)
Chloride: 106 mmol/L (ref 98–111)
Creatinine, Ser: 1.59 mg/dL — ABNORMAL HIGH (ref 0.61–1.24)
GFR calc Af Amer: 43 mL/min — ABNORMAL LOW (ref >60)
GFR calc non Af Amer: 37 mL/min — ABNORMAL LOW (ref >60)
Glucose, Bld: 85 mg/dL (ref 70–99)
Potassium: 3.4 mmol/L — ABNORMAL LOW (ref 3.5–5.1)
Sodium: 141 mmol/L (ref 135–145)
Total Bilirubin: 1.7 mg/dL — ABNORMAL HIGH (ref 0.3–1.2)
Total Protein: 6.3 g/dL — ABNORMAL LOW (ref 6.5–8.1)

## 2019-12-07 MED ORDER — POTASSIUM CHLORIDE CRYS ER 20 MEQ PO TBCR
20.0000 meq | EXTENDED_RELEASE_TABLET | Freq: Once | ORAL | Status: AC
  Administered 2019-12-07: 10 meq via ORAL
  Filled 2019-12-07: qty 1

## 2019-12-07 MED ORDER — POTASSIUM CHLORIDE 10 MEQ/100ML IV SOLN
10.0000 meq | INTRAVENOUS | Status: AC
  Administered 2019-12-07 (×2): 10 meq via INTRAVENOUS
  Filled 2019-12-07 (×2): qty 100

## 2019-12-07 NOTE — Progress Notes (Signed)
Received MD verbal order for soap sup enema. Administered enema per order. Pt had big impacted BM, tolerated well.

## 2019-12-07 NOTE — Progress Notes (Signed)
Pt ate a couple bites of pudding. Attempted to give pt scheduled potassium pill. Pill was broken in half and 1 half was given to pt with pudding. Pt coughed for several minutes after trying to swallow the pill.

## 2019-12-07 NOTE — Consult Note (Addendum)
Palliative Medicine  Name: Leonard Simpson Date: 12/07/2019 MRN: [id number]  DOB: 1924-02-09  Patient Care Team: [facility] as PCP - General    REASON FOR CONSULTATION: Leonard Simpson is a 86 y.o. male with multiple medical problems including CAD, hypertension, hyperlipidemia, BPH, GERD, who was admitted to hospital on 12/02/2019 with altered mental status, rhabdomyolysis, and pneumonia, after being found down on the floor at home for an unknown amount of time.  Palliative care was consulted to help address goals.  SOCIAL HISTORY:     reports that he has never smoked. He quit smokeless tobacco use about 38 years ago. He reports that he does not drink alcohol or use drugs.  Patient is a widower.  He lives at home alone.  He has a son who lives nearby and another son about 45 minutes away.  Patient worked as a Horticulturist, commercial.  ADVANCE DIRECTIVES:  Not on file  CODE STATUS: DNR  PAST MEDICAL HISTORY: Past Medical History:  Diagnosis Date  . Chest pain    SHARP, KNIFE-LIKE PAIN   . Coronary artery disease   . GERD (gastroesophageal reflux disease)   . Hypercholesterolemia   . Hypertension   . Left ventricular dysfunction   . MI (myocardial infarction) ([place]) 2009  . SOB (shortness of breath)     PAST SURGICAL HISTORY:  Past Surgical History:  Procedure Laterality Date  . APPENDECTOMY    . CARDIAC CATHETERIZATION  05/2010   NONOBSTRUCTIVE DISEASE  . CATARACT EXTRACTION      HEMATOLOGY/ONCOLOGY HISTORY:  Oncology History   No history exists.    ALLERGIES:  has No Known Allergies.  MEDICATIONS:  Current Facility-Administered Medications  Medication Dose Route Frequency Provider Last Rate Last Admin  . 0.9 %  sodium chloride infusion   Intravenous Continuous Dana Allan I, MD 75 mL/hr at 12/07/19 1011 New Bag at 12/07/19 1011  . acetaminophen (TYLENOL) tablet 650 mg  650 mg Oral Q6H PRN Shela Leff, MD       Or  . acetaminophen (TYLENOL)  suppository 650 mg  650 mg Rectal Q6H PRN Shela Leff, MD      . ceFEPIme (MAXIPIME) 2 g in sodium chloride 0.9 % 100 mL IVPB  2 g Intravenous Q24H Sherren Kerns D, RPH 200 mL/hr at 12/06/19 2147 2 g at 12/06/19 2147  . Chlorhexidine Gluconate Cloth 2 % PADS 6 each  6 each Topical Daily Shelda Pal, DO   6 each at 12/07/19 1009  . collagenase (SANTYL) ointment   Topical Daily Shelda Pal, DO   1 application at 21/30/86 1012  . docusate sodium (COLACE) capsule 100 mg  100 mg Oral BID Dana Allan I, MD      . heparin injection 5,000 Units  5,000 Units Subcutaneous Q8H Shela Leff, MD   5,000 Units at 12/07/19 0601  . nitroGLYCERIN (NITROSTAT) SL tablet 0.4 mg  0.4 mg Sublingual Q5 min PRN Shelda Pal, DO      . OLANZapine zydis (ZYPREXA) disintegrating tablet 5 mg  5 mg Oral QHS Shelda Pal, DO   5 mg at 12/06/19 2140  . pantoprazole (PROTONIX) EC tablet 20 mg  20 mg Oral Daily Shelda Pal, DO   20 mg at 12/04/19 1741  . polyethylene glycol (MIRALAX / GLYCOLAX) packet 17 g  17 g Oral Daily Shela Leff, MD   17 g at 12/04/19 0936  . potassium chloride 10 mEq in 100 mL IVPB  10  mEq Intravenous Q1 Hr x 2 Dana Allan I, MD      . senna [facility]) tablet 8.6 mg  1 tablet Oral Daily Dana Allan I, MD      . tamsulosin (FLOMAX) capsule 0.4 mg  0.4 mg Oral Daily Shelda Pal, DO   0.4 mg at 12/04/19 1741  . vancomycin (VANCOCIN) IVPB 1000 mg/200 mL premix  1,000 mg Intravenous Q48H Werner Lean, [facility]   Stopped at 12/05/19 2312    VITAL SIGNS: BP (!) 156/100 (BP Location: Left Arm)   Pulse (!) 104   Temp 97.6 F (36.4 C) (Oral)   Resp 20   Ht _0  (1.676 m)   Wt 130 lb (59 kg)   SpO2 94%   BMI 20.98 kg/m  Filed Weights   11/28/2019 1850  Weight: 130 lb (59 kg)    Estimated body mass index is 20.98 kg/m as calculated from the following:   Height as of this encounter: _1  (1.676 m).    Weight as of this encounter: 130 lb (59 kg).  LABS: CBC:    Component Value Date/Time   WBC 10.5 12/07/2019 0436   HGB 11.9 (L) 12/07/2019 0436   HCT 36.9 (L) 12/07/2019 0436   PLT 130 (L) 12/07/2019 0436   MCV 97.6 12/07/2019 0436   NEUTROABS 4.4 01/16/2011 1529   LYMPHSABS 2.0 01/16/2011 1529   MONOABS 0.8 01/16/2011 1529   EOSABS 0.5 01/16/2011 1529   BASOSABS 0.1 01/16/2011 1529   Comprehensive Metabolic Panel:    Component Value Date/Time   NA 141 12/07/2019 0436   K 3.4 (L) 12/07/2019 0436   CL 106 12/07/2019 0436   CO2 22 12/07/2019 0436   BUN 28 (H) 12/07/2019 0436   CREATININE 1.59 (H) 12/07/2019 0436   GLUCOSE 85 12/07/2019 0436   CALCIUM 8.5 (L) 12/07/2019 0436   AST 55 (H) 12/07/2019 0436   ALT 41 12/07/2019 0436   ALKPHOS 52 12/07/2019 0436   BILITOT 1.7 (H) 12/07/2019 0436   PROT 6.3 (L) 12/07/2019 0436   ALBUMIN 2.5 (L) 12/07/2019 0436    RADIOGRAPHIC STUDIES: CT ABDOMEN PELVIS WO CONTRAST  Result Date: 12/06/2019 CLINICAL DATA:  Cholelithiasis, history of trauma, concern for abdominal abscess EXAM: CT ABDOMEN AND PELVIS WITHOUT CONTRAST TECHNIQUE: Multidetector CT imaging of the abdomen and pelvis was performed following the standard protocol without IV contrast. COMPARISON:  11/20/2019 FINDINGS: This evaluation is limited by patient respiratory motion throughout the examination. Lower chest: Diffuse bibasilar scarring and fibrosis. Interval development of small bilateral pleural effusions and patchy bibasilar atelectasis. Hepatobiliary: Gallstones are less well visualized due to respiratory motion. No evidence of cholecystitis. Liver remains unremarkable. Pancreas: No gross pancreatic abnormality. Spleen: No gross splenic abnormality. Adrenals/Urinary Tract: No urinary tract calculi or obstructive uropathy. Bladder is decompressed with a Foley catheter. The adrenals are unremarkable. Stomach/Bowel: Fecal impaction within the rectal vault unchanged. No bowel  obstruction or ileus. Vascular/Lymphatic: Severe atherosclerosis of the abdominal aorta. No pathologic adenopathy. Reproductive: Stable enlarged prostate. Other: Subcutaneous edema in the bilateral flanks again noted. No free fluid within the abdomen or pelvis. No free intra-abdominal gas. Musculoskeletal: No acute displaced fractures. Reconstructed images demonstrate no additional findings. IMPRESSION: 1. Limited study due to patient motion throughout the exam. 2. Fecal impaction. 3. Small bilateral pleural effusions with bibasilar atelectasis. 4. Cholelithiasis without cholecystitis. 5. Severe atherosclerosis. Electronically Signed   By: Randa Ngo M.D.   On: 12/06/2019 01:09   CT ABDOMEN PELVIS WO CONTRAST  Result Date: 11/18/2019 CLINICAL DATA:  Trauma. EXAM: CT HEAD WITHOUT CONTRAST CT MAXILLOFACIAL WITHOUT CONTRAST CT CERVICAL SPINE WITHOUT CONTRAST CT CHEST, ABDOMEN AND PELVIS WITHOUT CONTRAST TECHNIQUE: Contiguous axial images were obtained from the base of the skull through the vertex without intravenous contrast. Multidetector CT imaging of the maxillofacial structures was performed. Multiplanar CT image reconstructions were also generated. A small metallic BB was placed on the right temple in order to reliably differentiate right from left. Multidetector CT imaging of the cervical spine was performed without intravenous contrast. Multiplanar CT image reconstructions were also generated. Multidetector CT imaging of the chest, abdomen and pelvis was performed following the standard protocol without IV contrast. COMPARISON:  None. FINDINGS: CT HEAD FINDINGS Brain: No evidence of acute infarction, hemorrhage, hydrocephalus, extra-axial collection or mass lesion/mass effect. There is extensive volume loss in addition to extensive chronic microvascular ischemic changes. 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 Vascular: No hyperdense vessel or unexpected calcification. Skull: Normal.  Negative for fracture or focal lesion. Other: None. CT MAXILLOFACIAL FINDINGS Osseous: The evaluation of the osseous structures is significantly limited by motion artifact. Given this limitation, there is no definite acute osseous abnormality. Orbits: Negative. No traumatic or inflammatory finding. Sinuses: Clear. Soft tissues: Negative. CT CERVICAL SPINE FINDINGS Alignment: Normal. Skull base and vertebrae: No acute fracture. No primary bone lesion or focal pathologic process. Soft tissues and spinal canal: No prevertebral fluid or swelling. No visible canal hematoma. Disc levels: Multilevel degenerative changes are noted throughout the cervical spine, greatest at the C5-C6 and C6-C7 levels. Other: None. CT CHEST FINDINGS Cardiovascular: Heart size is normal. Advanced atherosclerotic changes are noted of the thoracic aorta and coronary arteries. There is no significant pericardial effusion. Mediastinum/Nodes: --No mediastinal or hilar lymphadenopathy. --No axillary lymphadenopathy. --No supraclavicular lymphadenopathy. --Normal thyroid gland. --The esophagus is unremarkable Lungs/Pleura: There are areas of honeycombing involving the lung bases and lung apices. There are coarse reticular lung markings bilaterally. There is a somewhat masslike airspace opacity involving the right upper lobe measuring approximately 1.8 x 2.2 cm (axial series 5, image 57). The trachea is unremarkable. There is no pneumothorax or significant pleural effusion. The left hemidiaphragm is elevated. Musculoskeletal: No chest wall abnormality. No acute or significant osseous findings. CT ABDOMEN AND PELVIS FINDINGS Hepatobiliary: The liver is normal. Cholelithiasis without acute inflammation.The common bile duct is dilated measuring up to approximately 1.4 cm. Pancreas: Normal contours without ductal dilatation. No peripancreatic fluid collection. Spleen: No splenic laceration or hematoma. Adrenals/Urinary Tract: --Adrenal glands: No adrenal  hemorrhage. --Right kidney/ureter: No hydronephrosis or perinephric hematoma. --Left kidney/ureter: No hydronephrosis or perinephric hematoma. --Urinary bladder: The bladder is decompressed with a Foley catheter. Stomach/Bowel: --Stomach/Duodenum: No hiatal hernia or other gastric abnormality. Normal duodenal course and caliber. --Small bowel: No dilatation or inflammation. --Colon: There is a large amount of well-formed stool at the level of the rectum. There is a moderate amount of stool throughout the remaining portions of the colon. --Appendix: Not visualized. No right lower quadrant inflammation or free fluid. Vascular/Lymphatic: Advanced vascular calcifications are noted. The infrarenal abdominal aorta measures up to approximately 2.8 cm in diameter. --No retroperitoneal lymphadenopathy. --No mesenteric lymphadenopathy. --No pelvic or inguinal lymphadenopathy. Reproductive: The prostate gland is enlarged. Other: No ascites or free air. There is mild body wall edema. Musculoskeletal. No acute displaced fractures. IMPRESSION: 1. No acute intracranial abnormality. Advanced atrophy and chronic microvascular ischemic changes are noted. 2. No acute cervical spine fracture. 3. No acute facial bone fracture, however  evaluation was limited by significant motion artifact. 4. Multiple lung findings are noted as detailed above with differential considerations fibrotic lung disease with a superimposed atypical infectious process such as viral pneumonia not excluded. 5. There is a masslike airspace opacity in the right upper lobe as detailed above. This may represent an area of consolidation, however an underlying mass is not excluded. A three-month follow-up CT of the chest is recommended. 6. Ectatic abdominal aorta at risk for aneurysm development, currently measuring 2.8 cm. Recommend followup by ultrasound in 5 years. This recommendation follows ACR consensus guidelines: White Paper of the ACR Incidental Findings  Committee II on Vascular Findings. J Am Coll Radiol 2013; 10:789-794. Aortic aneurysm NOS (ICD10-I71.9) 7. Cholelithiasis with dilated common bile duct measuring up to 1.4 cm. No CT evidence of acute cholecystitis. If there is clinical concern for acute biliary pathology, recommend further evaluation with MRCP with the patient is clinically stable. An emergent MRCP would likely be degraded by significant motion artifact. 8. Large amount of well-formed stool at the level of the rectum. This may represent fecal impaction. 9. Aortic Atherosclerosis (ICD10-I70.0). Electronically Signed   By: Constance Holster M.D.   On: 11/26/2019 21:17   CT Head Wo Contrast  Result Date: 11/25/2019 CLINICAL DATA:  Trauma. EXAM: CT HEAD WITHOUT CONTRAST CT MAXILLOFACIAL WITHOUT CONTRAST CT CERVICAL SPINE WITHOUT CONTRAST CT CHEST, ABDOMEN AND PELVIS WITHOUT CONTRAST TECHNIQUE: Contiguous axial images were obtained from the base of the skull through the vertex without intravenous contrast. Multidetector CT imaging of the maxillofacial structures was performed. Multiplanar CT image reconstructions were also generated. A small metallic BB was placed on the right temple in order to reliably differentiate right from left. Multidetector CT imaging of the cervical spine was performed without intravenous contrast. Multiplanar CT image reconstructions were also generated. Multidetector CT imaging of the chest, abdomen and pelvis was performed following the standard protocol without IV contrast. COMPARISON:  None. FINDINGS: CT HEAD FINDINGS Brain: No evidence of acute infarction, hemorrhage, hydrocephalus, extra-axial collection or mass lesion/mass effect. There is extensive volume loss in addition to extensive chronic microvascular ischemic changes. 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 Vascular: No hyperdense vessel or unexpected calcification. Skull: Normal. Negative for fracture or focal lesion. Other: None. CT  MAXILLOFACIAL FINDINGS Osseous: The evaluation of the osseous structures is significantly limited by motion artifact. Given this limitation, there is no definite acute osseous abnormality. Orbits: Negative. No traumatic or inflammatory finding. Sinuses: Clear. Soft tissues: Negative. CT CERVICAL SPINE FINDINGS Alignment: Normal. Skull base and vertebrae: No acute fracture. No primary bone lesion or focal pathologic process. Soft tissues and spinal canal: No prevertebral fluid or swelling. No visible canal hematoma. Disc levels: Multilevel degenerative changes are noted throughout the cervical spine, greatest at the C5-C6 and C6-C7 levels. Other: None. CT CHEST FINDINGS Cardiovascular: Heart size is normal. Advanced atherosclerotic changes are noted of the thoracic aorta and coronary arteries. There is no significant pericardial effusion. Mediastinum/Nodes: --No mediastinal or hilar lymphadenopathy. --No axillary lymphadenopathy. --No supraclavicular lymphadenopathy. --Normal thyroid gland. --The esophagus is unremarkable Lungs/Pleura: There are areas of honeycombing involving the lung bases and lung apices. There are coarse reticular lung markings bilaterally. There is a somewhat masslike airspace opacity involving the right upper lobe measuring approximately 1.8 x 2.2 cm (axial series 5, image 57). The trachea is unremarkable. There is no pneumothorax or significant pleural effusion. The left hemidiaphragm is elevated. Musculoskeletal: No chest wall abnormality. No acute or significant osseous findings. CT  ABDOMEN AND PELVIS FINDINGS Hepatobiliary: The liver is normal. Cholelithiasis without acute inflammation.The common bile duct is dilated measuring up to approximately 1.4 cm. Pancreas: Normal contours without ductal dilatation. No peripancreatic fluid collection. Spleen: No splenic laceration or hematoma. Adrenals/Urinary Tract: --Adrenal glands: No adrenal hemorrhage. --Right kidney/ureter: No hydronephrosis or  perinephric hematoma. --Left kidney/ureter: No hydronephrosis or perinephric hematoma. --Urinary bladder: The bladder is decompressed with a Foley catheter. Stomach/Bowel: --Stomach/Duodenum: No hiatal hernia or other gastric abnormality. Normal duodenal course and caliber. --Small bowel: No dilatation or inflammation. --Colon: There is a large amount of well-formed stool at the level of the rectum. There is a moderate amount of stool throughout the remaining portions of the colon. --Appendix: Not visualized. No right lower quadrant inflammation or free fluid. Vascular/Lymphatic: Advanced vascular calcifications are noted. The infrarenal abdominal aorta measures up to approximately 2.8 cm in diameter. --No retroperitoneal lymphadenopathy. --No mesenteric lymphadenopathy. --No pelvic or inguinal lymphadenopathy. Reproductive: The prostate gland is enlarged. Other: No ascites or free air. There is mild body wall edema. Musculoskeletal. No acute displaced fractures. IMPRESSION: 1. No acute intracranial abnormality. Advanced atrophy and chronic microvascular ischemic changes are noted. 2. No acute cervical spine fracture. 3. No acute facial bone fracture, however evaluation was limited by significant motion artifact. 4. Multiple lung findings are noted as detailed above with differential considerations fibrotic lung disease with a superimposed atypical infectious process such as viral pneumonia not excluded. 5. There is a masslike airspace opacity in the right upper lobe as detailed above. This may represent an area of consolidation, however an underlying mass is not excluded. A three-month follow-up CT of the chest is recommended. 6. Ectatic abdominal aorta at risk for aneurysm development, currently measuring 2.8 cm. Recommend followup by ultrasound in 5 years. This recommendation follows ACR consensus guidelines: White Paper of the ACR Incidental Findings Committee II on Vascular Findings. J Am Coll Radiol 2013;  10:789-794. Aortic aneurysm NOS (ICD10-I71.9) 7. Cholelithiasis with dilated common bile duct measuring up to 1.4 cm. No CT evidence of acute cholecystitis. If there is clinical concern for acute biliary pathology, recommend further evaluation with MRCP with the patient is clinically stable. An emergent MRCP would likely be degraded by significant motion artifact. 8. Large amount of well-formed stool at the level of the rectum. This may represent fecal impaction. 9. Aortic Atherosclerosis (ICD10-I70.0). Electronically Signed   By: Constance Holster M.D.   On: 11/10/2019 21:17   CT CHEST WO CONTRAST  Result Date: 11/14/2019 CLINICAL DATA:  Trauma. EXAM: CT HEAD WITHOUT CONTRAST CT MAXILLOFACIAL WITHOUT CONTRAST CT CERVICAL SPINE WITHOUT CONTRAST CT CHEST, ABDOMEN AND PELVIS WITHOUT CONTRAST TECHNIQUE: Contiguous axial images were obtained from the base of the skull through the vertex without intravenous contrast. Multidetector CT imaging of the maxillofacial structures was performed. Multiplanar CT image reconstructions were also generated. A small metallic BB was placed on the right temple in order to reliably differentiate right from left. Multidetector CT imaging of the cervical spine was performed without intravenous contrast. Multiplanar CT image reconstructions were also generated. Multidetector CT imaging of the chest, abdomen and pelvis was performed following the standard protocol without IV contrast. COMPARISON:  None. FINDINGS: CT HEAD FINDINGS Brain: No evidence of acute infarction, hemorrhage, hydrocephalus, extra-axial collection or mass lesion/mass effect. There is extensive volume loss in addition to extensive chronic microvascular ischemic changes. 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 Vascular: No hyperdense vessel or unexpected calcification. Skull: Normal. Negative for fracture or focal lesion. Other:  None. CT MAXILLOFACIAL FINDINGS Osseous: The evaluation of the osseous  structures is significantly limited by motion artifact. Given this limitation, there is no definite acute osseous abnormality. Orbits: Negative. No traumatic or inflammatory finding. Sinuses: Clear. Soft tissues: Negative. CT CERVICAL SPINE FINDINGS Alignment: Normal. Skull base and vertebrae: No acute fracture. No primary bone lesion or focal pathologic process. Soft tissues and spinal canal: No prevertebral fluid or swelling. No visible canal hematoma. Disc levels: Multilevel degenerative changes are noted throughout the cervical spine, greatest at the C5-C6 and C6-C7 levels. Other: None. CT CHEST FINDINGS Cardiovascular: Heart size is normal. Advanced atherosclerotic changes are noted of the thoracic aorta and coronary arteries. There is no significant pericardial effusion. Mediastinum/Nodes: --No mediastinal or hilar lymphadenopathy. --No axillary lymphadenopathy. --No supraclavicular lymphadenopathy. --Normal thyroid gland. --The esophagus is unremarkable Lungs/Pleura: There are areas of honeycombing involving the lung bases and lung apices. There are coarse reticular lung markings bilaterally. There is a somewhat masslike airspace opacity involving the right upper lobe measuring approximately 1.8 x 2.2 cm (axial series 5, image 57). The trachea is unremarkable. There is no pneumothorax or significant pleural effusion. The left hemidiaphragm is elevated. Musculoskeletal: No chest wall abnormality. No acute or significant osseous findings. CT ABDOMEN AND PELVIS FINDINGS Hepatobiliary: The liver is normal. Cholelithiasis without acute inflammation.The common bile duct is dilated measuring up to approximately 1.4 cm. Pancreas: Normal contours without ductal dilatation. No peripancreatic fluid collection. Spleen: No splenic laceration or hematoma. Adrenals/Urinary Tract: --Adrenal glands: No adrenal hemorrhage. --Right kidney/ureter: No hydronephrosis or perinephric hematoma. --Left kidney/ureter: No hydronephrosis  or perinephric hematoma. --Urinary bladder: The bladder is decompressed with a Foley catheter. Stomach/Bowel: --Stomach/Duodenum: No hiatal hernia or other gastric abnormality. Normal duodenal course and caliber. --Small bowel: No dilatation or inflammation. --Colon: There is a large amount of well-formed stool at the level of the rectum. There is a moderate amount of stool throughout the remaining portions of the colon. --Appendix: Not visualized. No right lower quadrant inflammation or free fluid. Vascular/Lymphatic: Advanced vascular calcifications are noted. The infrarenal abdominal aorta measures up to approximately 2.8 cm in diameter. --No retroperitoneal lymphadenopathy. --No mesenteric lymphadenopathy. --No pelvic or inguinal lymphadenopathy. Reproductive: The prostate gland is enlarged. Other: No ascites or free air. There is mild body wall edema. Musculoskeletal. No acute displaced fractures. IMPRESSION: 1. No acute intracranial abnormality. Advanced atrophy and chronic microvascular ischemic changes are noted. 2. No acute cervical spine fracture. 3. No acute facial bone fracture, however evaluation was limited by significant motion artifact. 4. Multiple lung findings are noted as detailed above with differential considerations fibrotic lung disease with a superimposed atypical infectious process such as viral pneumonia not excluded. 5. There is a masslike airspace opacity in the right upper lobe as detailed above. This may represent an area of consolidation, however an underlying mass is not excluded. A three-month follow-up CT of the chest is recommended. 6. Ectatic abdominal aorta at risk for aneurysm development, currently measuring 2.8 cm. Recommend followup by ultrasound in 5 years. This recommendation follows ACR consensus guidelines: White Paper of the ACR Incidental Findings Committee II on Vascular Findings. J Am Coll Radiol 2013; 10:789-794. Aortic aneurysm NOS (ICD10-I71.9) 7. Cholelithiasis  with dilated common bile duct measuring up to 1.4 cm. No CT evidence of acute cholecystitis. If there is clinical concern for acute biliary pathology, recommend further evaluation with MRCP with the patient is clinically stable. An emergent MRCP would likely be degraded by significant motion artifact. 8. Large amount of well-formed stool  at the level of the rectum. This may represent fecal impaction. 9. Aortic Atherosclerosis (ICD10-I70.0). Electronically Signed   By: Constance Holster M.D.   On: 12/02/2019 21:17   CT Cervical Spine Wo Contrast  Result Date: 11/12/2019 CLINICAL DATA:  Trauma. EXAM: CT HEAD WITHOUT CONTRAST CT MAXILLOFACIAL WITHOUT CONTRAST CT CERVICAL SPINE WITHOUT CONTRAST CT CHEST, ABDOMEN AND PELVIS WITHOUT CONTRAST TECHNIQUE: Contiguous axial images were obtained from the base of the skull through the vertex without intravenous contrast. Multidetector CT imaging of the maxillofacial structures was performed. Multiplanar CT image reconstructions were also generated. A small metallic BB was placed on the right temple in order to reliably differentiate right from left. Multidetector CT imaging of the cervical spine was performed without intravenous contrast. Multiplanar CT image reconstructions were also generated. Multidetector CT imaging of the chest, abdomen and pelvis was performed following the standard protocol without IV contrast. COMPARISON:  None. FINDINGS: CT HEAD FINDINGS Brain: No evidence of acute infarction, hemorrhage, hydrocephalus, extra-axial collection or mass lesion/mass effect. There is extensive volume loss in addition to extensive chronic microvascular ischemic changes. 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 Vascular: No hyperdense vessel or unexpected calcification. Skull: Normal. Negative for fracture or focal lesion. Other: None. CT MAXILLOFACIAL FINDINGS Osseous: The evaluation of the osseous structures is significantly limited by motion artifact.  Given this limitation, there is no definite acute osseous abnormality. Orbits: Negative. No traumatic or inflammatory finding. Sinuses: Clear. Soft tissues: Negative. CT CERVICAL SPINE FINDINGS Alignment: Normal. Skull base and vertebrae: No acute fracture. No primary bone lesion or focal pathologic process. Soft tissues and spinal canal: No prevertebral fluid or swelling. No visible canal hematoma. Disc levels: Multilevel degenerative changes are noted throughout the cervical spine, greatest at the C5-C6 and C6-C7 levels. Other: None. CT CHEST FINDINGS Cardiovascular: Heart size is normal. Advanced atherosclerotic changes are noted of the thoracic aorta and coronary arteries. There is no significant pericardial effusion. Mediastinum/Nodes: --No mediastinal or hilar lymphadenopathy. --No axillary lymphadenopathy. --No supraclavicular lymphadenopathy. --Normal thyroid gland. --The esophagus is unremarkable Lungs/Pleura: There are areas of honeycombing involving the lung bases and lung apices. There are coarse reticular lung markings bilaterally. There is a somewhat masslike airspace opacity involving the right upper lobe measuring approximately 1.8 x 2.2 cm (axial series 5, image 57). The trachea is unremarkable. There is no pneumothorax or significant pleural effusion. The left hemidiaphragm is elevated. Musculoskeletal: No chest wall abnormality. No acute or significant osseous findings. CT ABDOMEN AND PELVIS FINDINGS Hepatobiliary: The liver is normal. Cholelithiasis without acute inflammation.The common bile duct is dilated measuring up to approximately 1.4 cm. Pancreas: Normal contours without ductal dilatation. No peripancreatic fluid collection. Spleen: No splenic laceration or hematoma. Adrenals/Urinary Tract: --Adrenal glands: No adrenal hemorrhage. --Right kidney/ureter: No hydronephrosis or perinephric hematoma. --Left kidney/ureter: No hydronephrosis or perinephric hematoma. --Urinary bladder: The bladder  is decompressed with a Foley catheter. Stomach/Bowel: --Stomach/Duodenum: No hiatal hernia or other gastric abnormality. Normal duodenal course and caliber. --Small bowel: No dilatation or inflammation. --Colon: There is a large amount of well-formed stool at the level of the rectum. There is a moderate amount of stool throughout the remaining portions of the colon. --Appendix: Not visualized. No right lower quadrant inflammation or free fluid. Vascular/Lymphatic: Advanced vascular calcifications are noted. The infrarenal abdominal aorta measures up to approximately 2.8 cm in diameter. --No retroperitoneal lymphadenopathy. --No mesenteric lymphadenopathy. --No pelvic or inguinal lymphadenopathy. Reproductive: The prostate gland is enlarged. Other: No ascites or free air. There is  mild body wall edema. Musculoskeletal. No acute displaced fractures. IMPRESSION: 1. No acute intracranial abnormality. Advanced atrophy and chronic microvascular ischemic changes are noted. 2. No acute cervical spine fracture. 3. No acute facial bone fracture, however evaluation was limited by significant motion artifact. 4. Multiple lung findings are noted as detailed above with differential considerations fibrotic lung disease with a superimposed atypical infectious process such as viral pneumonia not excluded. 5. There is a masslike airspace opacity in the right upper lobe as detailed above. This may represent an area of consolidation, however an underlying mass is not excluded. A three-month follow-up CT of the chest is recommended. 6. Ectatic abdominal aorta at risk for aneurysm development, currently measuring 2.8 cm. Recommend followup by ultrasound in 5 years. This recommendation follows ACR consensus guidelines: White Paper of the ACR Incidental Findings Committee II on Vascular Findings. J Am Coll Radiol 2013; 10:789-794. Aortic aneurysm NOS (ICD10-I71.9) 7. Cholelithiasis with dilated common bile duct measuring up to 1.4 cm. No  CT evidence of acute cholecystitis. If there is clinical concern for acute biliary pathology, recommend further evaluation with MRCP with the patient is clinically stable. An emergent MRCP would likely be degraded by significant motion artifact. 8. Large amount of well-formed stool at the level of the rectum. This may represent fecal impaction. 9. Aortic Atherosclerosis (ICD10-I70.0). Electronically Signed   By: Constance Holster M.D.   On: 12/05/2019 21:17   DG Pelvis Portable  Result Date: 12/04/2019 CLINICAL DATA:  Fall EXAM: PORTABLE PELVIS 1-2 VIEWS COMPARISON:  None. FINDINGS: SI joints are non widened. Numerous phleboliths in the pelvis. Pubic symphysis and rami are intact. The femoral necks are obscured by the trochanter right greater than left. Both femoral heads project in joint. No definitive fracture seen. IMPRESSION: 1. Limited evaluation of right greater than left femoral neck due to positioning and obscuration of the neck by the trochanter. 2. No definite acute osseous abnormality is seen. Electronically Signed   By: Donavan Foil M.D.   On: 11/24/2019 19:37   DG Chest Port 1 View  Result Date: 11/21/2019 CLINICAL DATA:  Fall EXAM: PORTABLE CHEST 1 VIEW COMPARISON:  05/15/2010 FINDINGS: Chronic elevation of left diaphragm. Mild reticular opacity at the right CP angle and base, likely scarring/fibrosis. Linear scarring or atelectasis left base. Mild cardiomegaly with aortic atherosclerosis. No consolidation, pleural effusion, or pneumothorax. IMPRESSION: 1. Cardiomegaly without edema or focal pulmonary opacity 2. Chronic elevation of left diaphragm. Fibrosis/scarring suspected at both bases. Electronically Signed   By: Donavan Foil M.D.   On: 12/02/2019 19:36   DG Shoulder Right Portable  Result Date: 11/30/2019 CLINICAL DATA:  [place] today with right shoulder pain. EXAM: PORTABLE RIGHT SHOULDER COMPARISON:  None. FINDINGS: The shoulder is held in relative internal rotation. The humeral  head is properly located. Narrowed humeral acromial distance which could indicate rotator cuff pathology, either chronic or acute. AC joint unremarkable. Regional ribs are negative. IMPRESSION: No acute fracture or dislocation. Narrowed humeral acromial distance suggesting rotator cuff disease, age indeterminate. Electronically Signed   By: Nelson Chimes M.D.   On: 11/14/2019 22:18   DG Abd Portable 1V  Result Date: 12/07/2019 CLINICAL DATA:  Fecal impaction. EXAM: PORTABLE ABDOMEN - 1 VIEW COMPARISON:  CT scan 12/06/2019 FINDINGS: 0916 hours. No gaseous small bowel or colonic dilatation. Prominent stool volume noted in the rectum. Calcified gallstones noted right upper quadrant, better characterized on recent ultrasound. Bones are diffusely demineralized. Numerous phleboliths are seen over the atomic pelvis. IMPRESSION: Prominent rectal  stool volume. No small bowel or colonic dilatation. Electronically Signed   By: Misty Stanley M.D.   On: 12/07/2019 09:50   ECHOCARDIOGRAM COMPLETE  Result Date: 12/04/2019    ECHOCARDIOGRAM REPORT   Patient Name:   MIKAEEL PETROW Date of Exam: 12/04/2019 Medical Rec #:  [id number]        Height:       66.0 in Accession #:    [id number]       Weight:       130.0 lb Date of Birth:  11-Feb-1924        BSA:          1.665 m Patient Age:    40 years         BP:           120/84 mmHg Patient Gender: M                HR:           75 bpm. Exam Location:  Inpatient Procedure: 2D Echo, Cardiac Doppler and Color Doppler Indications:    R94.31 Abnormal EKG  History:        Patient has prior history of Echocardiogram examinations, most                 recent 01/18/2011. LV dysfunction, CAD and Previous Myocardial                 Infarction, TIA, Signs/Symptoms:Altered Mental Status, Chest                 Pain and Dyspnea; Risk Factors:Hypertension and Dyslipidemia.                 Rhabdomyolysis. Pneumonia.  Sonographer:    Roseanna Rainbow RDCS Referring Phys: [id number] [employer]   Sonographer Comments: Technically difficult study due to poor echo windows. Image acquisition challenging due to uncooperative patient. Patient has altered mental status. Very difficult study. Patient could not follow instructions and could not stop talking throughout exam. Patient could not keep arms down and out of the subcostal window. IMPRESSIONS  1. Left ventricular ejection fraction, by estimation, is 55 to 60%. The left ventricle has normal function. The left ventricle has no regional wall motion abnormalities. Left ventricular diastolic parameters are consistent with Grade I diastolic dysfunction (impaired relaxation). Elevated left ventricular end-diastolic pressure.  2. Right ventricular systolic function is normal. The right ventricular size is normal. Tricuspid regurgitation signal is inadequate for assessing PA pressure.  3. The mitral valve is normal in structure and function. Mild mitral valve regurgitation. No evidence of mitral stenosis.  4. The aortic valve was not well visualized. Severe aortic valve annular calcification. There is severe thickening of the aortic valve. There is severe calcifcation of the aortic valve. Aortic valve mean gradient measures 26.6 mmHg. Aortic valve peak gradient measures 47.2 mmHg. Aortic valve area, by VTI measures 0.74 cm. Aortic valve regurgitation is not visualized. Moderate to severe aortic valve stenosis.  5. The inferior vena cava is normal in size with greater than 50% respiratory variability, suggesting right atrial pressure of 3 mmHg.  6. Visually the AV apperas severly stenotic but images are of poor quality. The AVA is likely underestimated due to inaccurate LVOT measurement. The dimensionless index is 0.29 and mean gradient 64mHg. SVI is reduced at 34. Findings c/w moderate to severe AS. 7. Consider repeat echo to reassess AV once patient is less combative.  FINDINGS  Left Ventricle: Left ventricular ejection fraction,  by estimation, is 55 to 60%. The  left ventricle has normal function. The left ventricle has no regional wall motion abnormalities. The left ventricular internal cavity size was normal in size. There is  no left ventricular hypertrophy. Left ventricular diastolic parameters are consistent with Grade I diastolic dysfunction (impaired relaxation). Elevated left ventricular end-diastolic pressure. Right Ventricle: The right ventricular size is normal. No increase in right ventricular wall thickness. Right ventricular systolic function is normal. Tricuspid regurgitation signal is inadequate for assessing PA pressure. Left Atrium: Left atrial size was normal in size. Right Atrium: Right atrial size was normal in size. Pericardium: There is no evidence of pericardial effusion. Mitral Valve: The mitral valve is normal in structure and function. There is mild thickening of the anterior and posterior mitral valve leaflet(s). Normal mobility of the mitral valve leaflets. Mild mitral annular calcification. Mild mitral valve regurgitation. No evidence of mitral valve stenosis. Tricuspid Valve: The tricuspid valve is normal in structure. Tricuspid valve regurgitation is not demonstrated. No evidence of tricuspid stenosis. Aortic Valve: The aortic valve was not well visualized. . There is severe thickening and severe calcifcation of the aortic valve. Aortic valve regurgitation is not visualized. Moderate to severe aortic stenosis is present. Severe aortic valve annular calcification. There is severe thickening of the aortic valve. There is severe calcifcation of the aortic valve. Aortic valve mean gradient measures 30.0 mmHg. Aortic valve peak gradient measures 54.2 mmHg. Aortic valve area, by VTI measures 0.74 cm. Pulmonic Valve: The pulmonic valve was normal in structure. Pulmonic valve regurgitation is not visualized. No evidence of pulmonic stenosis. Aorta: The aortic root is normal in size and structure. Venous: The inferior vena cava is normal in size with  greater than 50% respiratory variability, suggesting right atrial pressure of 3 mmHg. IAS/Shunts: The interatrial septum appears to be lipomatous. No atrial level shunt detected by color flow Doppler.  LEFT VENTRICLE PLAX 2D LVIDd:         4.30 cm     Diastology LVIDs:         3.40 cm     LV e' lateral:   5.98 cm/s LV PW:         1.20 cm     LV E/e' lateral: 9.6 LV IVS:        1.00 cm     LV e' medial:    3.70 cm/s LVOT diam:     1.80 cm     LV E/e' medial:  15.6 LV SV:         56 LV SV Index:   34 LVOT Area:     2.54 cm  LV Volumes (MOD) LV vol d, MOD A2C: 50.0 ml LV vol d, MOD A4C: 87.1 ml LV vol s, MOD A2C: 24.3 ml LV vol s, MOD A4C: 38.5 ml LV SV MOD A2C:     25.7 ml LV SV MOD A4C:     87.1 ml LV SV MOD BP:      34.1 ml RIGHT VENTRICLE             IVC RV S prime:     13.30 cm/s  IVC diam: 1.50 cm TAPSE (M-mode): 2.1 cm LEFT ATRIUM             Index       RIGHT ATRIUM          Index LA diam:        3.20 cm 1.92 cm/m  RA Area:  8.52 cm LA Vol (A2C):   25.0 ml 15.01 ml/m RA Volume:   14.40 ml 8.65 ml/m LA Vol (A4C):   34.2 ml 20.54 ml/m LA Biplane Vol: 29.9 ml 17.96 ml/m  AORTIC VALVE AV Area (Vmax):    0.67 cm AV Area (Vmean):   0.73 cm AV Area (VTI):     0.74 cm AV Vmax:           368.00 cm/s AV Vmean:          238.800 cm/s AV VTI:            0.761 m AV Peak Grad:      54.2 mmHg AV Mean Grad:      30.0 mmHg LVOT Vmax:         97.00 cm/s LVOT Vmean:        68.600 cm/s LVOT VTI:          0.222 m LVOT/AV VTI ratio: 0.29  AORTA Ao Root diam: 3.00 cm MITRAL VALVE MV Area (PHT): 3.77 cm    SHUNTS MV Decel Time: 201 msec    Systemic VTI:  0.22 m MV E velocity: 57.70 cm/s  Systemic Diam: 1.80 cm MV A velocity: 84.40 cm/s MV E/A ratio:  0.68 Fransico Him MD Electronically signed by Fransico Him MD Signature Date/Time: 12/04/2019/12:34:16 PM    Final    CT Maxillofacial Wo Contrast  Result Date: 11/23/2019 CLINICAL DATA:  Trauma. EXAM: CT HEAD WITHOUT CONTRAST CT MAXILLOFACIAL WITHOUT CONTRAST CT CERVICAL  SPINE WITHOUT CONTRAST CT CHEST, ABDOMEN AND PELVIS WITHOUT CONTRAST TECHNIQUE: Contiguous axial images were obtained from the base of the skull through the vertex without intravenous contrast. Multidetector CT imaging of the maxillofacial structures was performed. Multiplanar CT image reconstructions were also generated. A small metallic BB was placed on the right temple in order to reliably differentiate right from left. Multidetector CT imaging of the cervical spine was performed without intravenous contrast. Multiplanar CT image reconstructions were also generated. Multidetector CT imaging of the chest, abdomen and pelvis was performed following the standard protocol without IV contrast. COMPARISON:  None. FINDINGS: CT HEAD FINDINGS Brain: No evidence of acute infarction, hemorrhage, hydrocephalus, extra-axial collection or mass lesion/mass effect. There is extensive volume loss in addition to extensive chronic microvascular ischemic changes. 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 Vascular: No hyperdense vessel or unexpected calcification. Skull: Normal. Negative for fracture or focal lesion. Other: None. CT MAXILLOFACIAL FINDINGS Osseous: The evaluation of the osseous structures is significantly limited by motion artifact. Given this limitation, there is no definite acute osseous abnormality. Orbits: Negative. No traumatic or inflammatory finding. Sinuses: Clear. Soft tissues: Negative. CT CERVICAL SPINE FINDINGS Alignment: Normal. Skull base and vertebrae: No acute fracture. No primary bone lesion or focal pathologic process. Soft tissues and spinal canal: No prevertebral fluid or swelling. No visible canal hematoma. Disc levels: Multilevel degenerative changes are noted throughout the cervical spine, greatest at the C5-C6 and C6-C7 levels. Other: None. CT CHEST FINDINGS Cardiovascular: Heart size is normal. Advanced atherosclerotic changes are noted of the thoracic aorta and coronary  arteries. There is no significant pericardial effusion. Mediastinum/Nodes: --No mediastinal or hilar lymphadenopathy. --No axillary lymphadenopathy. --No supraclavicular lymphadenopathy. --Normal thyroid gland. --The esophagus is unremarkable Lungs/Pleura: There are areas of honeycombing involving the lung bases and lung apices. There are coarse reticular lung markings bilaterally. There is a somewhat masslike airspace opacity involving the right upper lobe measuring approximately 1.8 x 2.2 cm (axial series 5, image 57).  The trachea is unremarkable. There is no pneumothorax or significant pleural effusion. The left hemidiaphragm is elevated. Musculoskeletal: No chest wall abnormality. No acute or significant osseous findings. CT ABDOMEN AND PELVIS FINDINGS Hepatobiliary: The liver is normal. Cholelithiasis without acute inflammation.The common bile duct is dilated measuring up to approximately 1.4 cm. Pancreas: Normal contours without ductal dilatation. No peripancreatic fluid collection. Spleen: No splenic laceration or hematoma. Adrenals/Urinary Tract: --Adrenal glands: No adrenal hemorrhage. --Right kidney/ureter: No hydronephrosis or perinephric hematoma. --Left kidney/ureter: No hydronephrosis or perinephric hematoma. --Urinary bladder: The bladder is decompressed with a Foley catheter. Stomach/Bowel: --Stomach/Duodenum: No hiatal hernia or other gastric abnormality. Normal duodenal course and caliber. --Small bowel: No dilatation or inflammation. --Colon: There is a large amount of well-formed stool at the level of the rectum. There is a moderate amount of stool throughout the remaining portions of the colon. --Appendix: Not visualized. No right lower quadrant inflammation or free fluid. Vascular/Lymphatic: Advanced vascular calcifications are noted. The infrarenal abdominal aorta measures up to approximately 2.8 cm in diameter. --No retroperitoneal lymphadenopathy. --No mesenteric lymphadenopathy. --No pelvic  or inguinal lymphadenopathy. Reproductive: The prostate gland is enlarged. Other: No ascites or free air. There is mild body wall edema. Musculoskeletal. No acute displaced fractures. IMPRESSION: 1. No acute intracranial abnormality. Advanced atrophy and chronic microvascular ischemic changes are noted. 2. No acute cervical spine fracture. 3. No acute facial bone fracture, however evaluation was limited by significant motion artifact. 4. Multiple lung findings are noted as detailed above with differential considerations fibrotic lung disease with a superimposed atypical infectious process such as viral pneumonia not excluded. 5. There is a masslike airspace opacity in the right upper lobe as detailed above. This may represent an area of consolidation, however an underlying mass is not excluded. A three-month follow-up CT of the chest is recommended. 6. Ectatic abdominal aorta at risk for aneurysm development, currently measuring 2.8 cm. Recommend followup by ultrasound in 5 years. This recommendation follows ACR consensus guidelines: White Paper of the ACR Incidental Findings Committee II on Vascular Findings. J Am Coll Radiol 2013; 10:789-794. Aortic aneurysm NOS (ICD10-I71.9) 7. Cholelithiasis with dilated common bile duct measuring up to 1.4 cm. No CT evidence of acute cholecystitis. If there is clinical concern for acute biliary pathology, recommend further evaluation with MRCP with the patient is clinically stable. An emergent MRCP would likely be degraded by significant motion artifact. 8. Large amount of well-formed stool at the level of the rectum. This may represent fecal impaction. 9. Aortic Atherosclerosis (ICD10-I70.0). Electronically Signed   By: Constance Holster M.D.   On: 11/18/2019 21:17   US Abdomen Limited RUQ  Result Date: 12/04/2019 CLINICAL DATA:  Elevated LFTs, altered mental status EXAM: ULTRASOUND ABDOMEN LIMITED RIGHT UPPER QUADRANT COMPARISON:  CT abdomen pelvis 11/18/2019 FINDINGS:  Gallbladder: Echogenic, shadowing gallstone noted in the neck of the gallbladder measuring up to 1.4 cm in maximum diameter. No gallbladder wall thickening or pericholecystic fluid. Sonographic Percell Miller sign is reportedly negative. Common bile duct: Diameter: 12 mm, dilated.  No visible intraductal gallstones. Liver: No focal lesion identified. Parenchymal echogenicity at the upper limits of normal. Portal vein is patent on color Doppler imaging with normal direction of blood flow towards the liver. Other: None. IMPRESSION: Cholelithiasis without evidence of acute cholecystitis. Extrahepatic biliary ductal dilatation without visible stone. Distal duct is poorly assessed. If concern for choledocholithiasis, correlate with serologies and consider MRCP for further evaluation. Electronically Signed   By: Lovena Le M.D.   On: 12/04/2019 03:54  PERFORMANCE STATUS (ECOG) : 4 - Bedbound  Review of Systems Unable to complete  Physical Exam General: NAD, frail appearing, thin Cardiovascular: regular rate and rhythm Pulmonary: clear ant fields Abdomen: soft, nontender, + bowel sounds GU: no suprapubic tenderness Extremities: no edema, no joint deformities Skin: no rashes Neurological: Alert but confused  IMPRESSION: Patient is more responsive today per son.  RN reports that he has had some confusion at times requiring Haldol and lorazepam.  Patient is currently confused and difficult to understand with a mumbling speech pattern.  I met with patient's son.  Together we reviewed patient's current medical problems.  Son is in agreement with the current scope of treatment.  He is hopeful that patient will continue to improve and he has already seen some improvement over the past several days.  However, he verbalized an understanding that patient is unlikely to return to his previous baseline.  Additionally, son seems to understand that patient could further decline and even be nearing end-of-life.  Son  thinks that patient will likely need placement in an SNF following this hospitalization.  I recommended the patient be followed by palliative care.  We did discuss the option of hospice involvement in the event of decline.    Patient is a DNR.  Patient does not have any advanced care directives.  Son would be interested in establishing those if patient's mental status improved.  Prior to this hospitalization, son says that patient was living at home alone.  Patient was fully independent with all of his own care.  Son denied patient having significant cognitive impairment.  He says that patient was recently planning to have someone re-shingle his roof and had calculated exactly how many shingles it would require.  PLAN: -Continue current scope of treatment -We will likely need SNF with palliative care versus hospice in the event of decline -DNR/DNI -We will follow   Time Total: 60 minutes  Visit consisted of counseling and education dealing with the complex and emotionally intense issues of symptom management and palliative care in the setting of serious and potentially life-threatening illness.Greater than 50%  of this time was spent counseling and coordinating care related to the above assessment and plan.  Signed by: Altha Harm, PhD, NP-C

## 2019-12-07 NOTE — Progress Notes (Signed)
Pharmacy Antibiotic Note  Leonard Simpson is a 86 y.o. male admitted on 2019/12/30 with pneumonia.  Pharmacy has been consulted for cefepime and vancomycin dosing.  Pt found to have rhabdomyolysis and is being treated with fluids. Renal function has been improving Scr 1.9>>1.59. ~ 2L UOP. WBC, vital signs, and temp are all WNLs.  Plan: Vancomycin 1000 mg IV every 48 hours. Goal AUC 400-550. Expected AUC: 450 SCr used: 1.59 Cefepime 2g IV every 24 hours Monitor renal function, WBC, clinical status, cultures, and temp   Height: 5\' 6"  (167.6 cm) Weight: 130 lb (59 kg) IBW/kg (Calculated) : 63.8  Temp (24hrs), Avg:97.6 F (36.4 C), Min:97.5 F (36.4 C), Max:97.7 F (36.5 C)  Recent Labs  Lab Dec 30, 2019 1955 12-30-2019 2011 30-Dec-2019 2242 12/04/19 0353 12/05/19 0704 12/06/19 0858 12/07/19 0436  WBC 20.6*  --   --  15.7* 9.6 10.1 10.5  CREATININE 1.98*  --   --  1.92* 1.91* 1.57* 1.59*  LATICACIDVEN  --  4.5* 1.7  --   --   --   --     Estimated Creatinine Clearance: 24.2 mL/min (A) (by C-G formula based on SCr of 1.59 mg/dL (H)).    No Known Allergies  Antimicrobials this admission: Vancomycin 2/24>> (12/31/2019) Cefepime 2/24>> (12/28/2019)  Microbiology results: 2/24 BCX: negative x4 days 2/25 COVID negative  Thank you for allowing pharmacy to be a part of this patient's care.  3/25, PharmD PGY1 Acute Care Pharmacy Resident 12/07/2019 11:34 AM

## 2019-12-07 NOTE — Social Work (Signed)
Aware PMT consult pending, will complete fl2 but hold off on formal SNF referral until after GOC are complete.   Octavio Graves, MSW, LCSW [facility] Clinical Social Work

## 2019-12-07 NOTE — [place] FL2 (Signed)
[place] MEDICAID FL2 LEVEL OF CARE SCREENING TOOL     IDENTIFICATION  Patient Name: Leonard Simpson Birthdate: [birth year] Sex: male Admission Date (Current Location): 12/05/2019  [facility] and [place] Number:  Producer, television/film/video and Address:  [facility] [place], [address] [place], [place] [address]      Provider Number: [id number]  Attending Physician Name and Address:  Barnetta Chapel, MD  Relative Name and Phone Number:       Current Level of Care: Hospital Recommended Level of Care: Skilled Nursing Facility Prior Approval Number:    Date Approved/Denied:   PASRR Number: [id number]  Discharge Plan: SNF    Current Diagnoses: Patient Active Problem List   Diagnosis Date Noted  . Dementia (HCC) 12/05/2019  . Wound of abdomen 12/05/2019  . Sepsis (HCC) 12/05/2019  . Rhabdomyolysis 12/04/2019  . AKI (acute kidney injury) (HCC) 12/04/2019  . Pneumonia 12/04/2019  . Acute metabolic encephalopathy 12/04/2019  . Elevated LFTs 12/04/2019  . Fall 12/04/2019  . Elevated troponin I level 12/04/2019  . BPH (benign prostatic hyperplasia) 12/04/2019  . Seasonal allergic rhinitis 01/12/2011  . Coronary artery disease   . MI (myocardial infarction) (HCC)   . Hypertension   . Hypercholesterolemia   . GERD (gastroesophageal reflux disease)   . Left ventricular dysfunction     Orientation RESPIRATION BLADDER Height & Weight     Self  Normal Incontinent, Indwelling catheter Weight: 130 lb (59 kg) Height:  5\' 6"  (167.6 cm)  BEHAVIORAL SYMPTOMS/MOOD NEUROLOGICAL BOWEL NUTRITION STATUS      Continent Diet(see discharge summary)  AMBULATORY STATUS COMMUNICATION OF NEEDS Skin   Extensive Assist Verbally Skin abrasions, Bruising, Other (Comment)(lacerations on knee/arm/foot/abdomen with santyl and gauze; generalized abrasions and ecchymosis)                       Personal Care Assistance Level of Assistance  Bathing, Feeding, Dressing  Bathing Assistance: Maximum assistance Feeding assistance: Limited assistance Dressing Assistance: Maximum assistance     Functional Limitations Info  Sight, Speech, Hearing Sight Info: Adequate Hearing Info: Adequate Speech Info: Adequate    SPECIAL CARE FACTORS FREQUENCY  OT (By licensed OT), PT (By licensed PT)     PT Frequency: 5x week OT Frequency: 5x week            Contractures Contractures Info: Not present    Additional Factors Info  Code Status, Allergies Code Status Info: DNR Allergies Info: No Known Allergies           Current Medications (12/07/2019):  This is the current hospital active medication list Current Facility-Administered Medications  Medication Dose Route Frequency Provider Last Rate Last Admin  . 0.9 %  sodium chloride infusion   Intravenous Continuous 12/26/2019 I, MD 75 mL/hr at 12/07/19 1011 New Bag at 12/07/19 1011  . acetaminophen (TYLENOL) tablet 650 mg  650 mg Oral Q6H PRN 12/27/2019, MD       Or  . acetaminophen (TYLENOL) suppository 650 mg  650 mg Rectal Q6H PRN John Giovanni, MD      . ceFEPIme (MAXIPIME) 2 g in sodium chloride 0.9 % 100 mL IVPB  2 g Intravenous Q24H John Giovanni D, RPH 200 mL/hr at 12/06/19 2147 2 g at 12/06/19 2147  . Chlorhexidine Gluconate Cloth 2 % PADS 6 each  6 each Topical Daily 2148, DO   6 each at 12/07/19 1009  . collagenase (SANTYL) ointment  Topical Daily Shelda Pal, DO   1 application at 24/40/10 1012  . docusate sodium (COLACE) capsule 100 mg  100 mg Oral BID Dana Allan I, MD      . heparin injection 5,000 Units  5,000 Units Subcutaneous Q8H Shela Leff, MD   5,000 Units at 12/07/19 0601  . nitroGLYCERIN (NITROSTAT) SL tablet 0.4 mg  0.4 mg Sublingual Q5 min PRN Shelda Pal, DO      . OLANZapine zydis (ZYPREXA) disintegrating tablet 5 mg  5 mg Oral QHS Shelda Pal, DO   5 mg at 12/06/19 2140  . pantoprazole  (PROTONIX) EC tablet 20 mg  20 mg Oral Daily Shelda Pal, DO   20 mg at 12/04/19 1741  . polyethylene glycol (MIRALAX / GLYCOLAX) packet 17 g  17 g Oral Daily Shela Leff, MD   17 g at 12/04/19 0936  . potassium chloride 10 mEq in 100 mL IVPB  10 mEq Intravenous Q1 Hr x 2 Dana Allan I, MD      . senna (SENOKOT) tablet 8.6 mg  1 tablet Oral Daily Dana Allan I, MD      . tamsulosin (FLOMAX) capsule 0.4 mg  0.4 mg Oral Daily Shelda Pal, DO   0.4 mg at 12/04/19 1741  . vancomycin (VANCOCIN) IVPB 1000 mg/200 mL premix  1,000 mg Intravenous Q48H [facility], [facility]   Stopped at 12/05/19 2312     Discharge Medications: Please see discharge summary for a list of discharge medications.  Relevant Imaging Results:  Relevant Lab Results:   Additional Information SS#[id number]  [place], [place]

## 2019-12-07 NOTE — Progress Notes (Signed)
Pt becoming increasingly anxious and trying to get out of the bed to get his dogs. Pt believes his dogs are jumping over the fence and wants to go get them. Pt reoriented. Bed alarm on.

## 2019-12-07 NOTE — Progress Notes (Signed)
PROGRESS NOTE    Leonard Simpson  WCB:[id number] DOB: 11-05-1923 DOA: 12/20/19 PCP: [facility]     Brief Narrative:  Patient is a 86 year old male with a medical history significant for CAD, hypertension, hyperlipidemia, BPH, GERD, and what sounds like dementia.  Patient presented with altered mental status and a fall on 2019-12-20.  Patient was found by son on the floor and noted to have multiple sores all over his body.  He was confused/disoriented on presentation.  His son helps him with ADLs.  He was at his baseline 2 days prior to admission when he was last seen.  CT chest concerning for possible pneumonia, Covid testing negative.  Treatment for rhabdomyolysis with fluids was initiated in addition to vancomycin and cefepime for pneumonia.  12/06/2019: Patient seen.  Patient is resting quietly.  Patient is also restricted.  CT scan of the abdomen and pelvis revealed fecal impaction and cholelithiasis.  Will start patient on senna and Colace.  Rhabdomyolysis has resolved, will decrease IV fluids to 75 cc/h normal saline.  Acute kidney injury is resolving slowly.  We will also try soap suds enema.  Updated patient's son, Leonard Simpson.  12/07/2019: Patient seen.  Patient is better today.  We will proceed with soapsuds enema.  Updated patient's son.  Assessment & Plan:   Principal Problem:   Rhabdomyolysis  CK today is 288.    Continue normal saline 75 cc an hour.    Continue to hold statin  Monitor renal panel and hep function panel  12/06/2019: Rhabdomyolysis has resolved.  Acute kidney injury is slowly resolving.   Active Problems:   Sepsis (HCC)  Sepsis syndrome has resolved  Completed treatment for pneumonia.    Patient is currently on IV vancomycin and cefepime.        Coronary artery disease/elevated troponin:  See documentation on admission (H&P).    As per H&P's documentation, this was discussed with cardiology team   Echocardiogram revealed moderate to severe aortic  stenosis.  Echo is of poor quality.  Repeat echocardiogram once patient is less combative.   Fecal impaction/severe constipation:  Proceed with soapsuds enema.  Consider repeating soapsuds enema tomorrow as well.  Hypertension  Normotensive, continue to hold home medications    Hypercholesterolemia  Hold statin    GERD (gastroesophageal reflux disease)  Protonix 20 mg daily    Elevated LFTs  Improving  2/2 rhabdo    AKI (acute kidney injury) (HCC)  Improving.   Continue to hold nephrotoxins  Decrease IV fluid to 75 cc/h.  Continue treatment renal function and electrolytes.      Pneumonia  Continue vancomycin and cefepime  Complete course of antibiotics.  Decrease IV fluids.    Acute metabolic encephalopathy  Patient is resting quietly for now.    Fall  PT/OT    Elevated troponin I level  This was discussed with cardiology.    BPH (benign prostatic hyperplasia)  Flomax 0.4 mg daily    Dementia (HCC)  Add Zyprexa 5 mg nightly, I think this will help [place] avoid benzos    Wound of abdomen  Appreciate wound care team  CT abdomen and pelvis result is as documented above.    Overall, patient's prognosis is guarded.  Consult palliative care team.  DVT prophylaxis: Heparin Code Status: DNR Family Communication: Son, Leonard Simpson updated today Coming From: Home Disposition Plan: SNF.  This will also depend on hospital course.  Prognosis is guarded. Barriers to Discharge: Approval, clinical improvement    Antimicrobials:  Anti-infectives (From admission, onward)   Start     Dose/Rate Route Frequency Ordered Stop   12/04/19 2200  ceFEPIme (MAXIPIME) 2 g in sodium chloride 0.9 % 100 mL IVPB     2 g 200 mL/hr over 30 Minutes Intravenous Every 24 hours 12/04/19 0048     12-21-19 2245  vancomycin (VANCOCIN) IVPB 1000 mg/200 mL premix     1,000 mg 200 mL/hr over 60 Minutes Intravenous Every 48 hours 12-21-19 2230     12-21-19 2215  ceFEPIme (MAXIPIME) 2 g in sodium chloride  0.9 % 100 mL IVPB     2 g 200 mL/hr over 30 Minutes Intravenous  Once 21-Dec-2019 2207 12/04/19 0258       Objective: Vitals:   12/06/19 1300 12/06/19 1959 12/07/19 0458 12/07/19 0757  BP:  (!) 155/78 (!) 144/74 (!) 156/100  Pulse:  80 76 (!) 104  Resp:  (!) 22 20 20   Temp:  97.7 F (36.5 C) 97.7 F (36.5 C) 97.6 F (36.4 C)  TempSrc:  Oral Oral Oral  SpO2: 100% 94%    Weight:      Height:        Intake/Output Summary (Last 24 hours) at 12/07/2019 0910 Last data filed at 12/07/2019 0457 Gross per 24 hour  Intake 1518.34 ml  Output 1925 ml  Net -406.66 ml   Filed Weights   12-21-19 1850  Weight: 59 kg    Examination:  General exam: Patient looks a lot better today.  No restraints.  Awake and alert. Respiratory system: Clear to auscultation.  Cardiovascular system: S1 & S2 heard, RRR.  Gastrointestinal system: Abdomen is nondistended, soft and nontender. Normal bowel sounds heard. Central nervous system: He is awake and alert.  Patient moves all extremities.   Extremities: No leg edema.  Data Reviewed: I have personally reviewed following labs and imaging studies  CBC: Recent Labs  Lab 2019/12/21 1955 12/04/19 0353 12/05/19 0704 12/06/19 0858 12/07/19 0436  WBC 20.6* 15.7* 9.6 10.1 10.5  HGB 13.1 11.6* 10.9* 10.7* 11.9*  HCT 42.4 35.6* 33.4* 33.1* 36.9*  MCV 102.4* 98.1 97.9 99.4 97.6  PLT 108* 112* 96* 99* 130*   Basic Metabolic Panel: Recent Labs  Lab 12/21/2019 1955 12/04/19 0103 12/04/19 0353 12/05/19 0704 12/06/19 0858 12/07/19 0436  NA 141  --  142 143 139 141  K 5.9*  --  3.9 3.7 4.2 3.4*  CL 109  --  110 109 108 106  CO2 21*  --  22 24 22 22   GLUCOSE 89  --  119* 81 85 85  BUN 42*  --  45* 45* 36* 28*  CREATININE 1.98*  --  1.92* 1.91* 1.57* 1.59*  CALCIUM 8.3*  --  7.7* 8.5* 8.2* 8.5*  MG  --  1.9  --   --   --   --    GFR: Estimated Creatinine Clearance: 24.2 mL/min (A) (by C-G formula based on SCr of 1.59 mg/dL (H)). Liver Function  Tests: Recent Labs  Lab 12-21-2019 1955 12/04/19 0353 12/05/19 0704 12/06/19 0858 12/07/19 0436  AST 152* 115* 79* 58* 55*  ALT 51* 40 41 39 41  ALKPHOS 52 43 48 41 52  BILITOT 1.7* 0.9 1.1 1.3* 1.7*  PROT 5.9* 5.4* 5.4* 5.2* 6.3*  ALBUMIN 2.8* 2.4* 2.3* 2.2* 2.5*   Recent Labs  Lab 12/04/19 0106  AMMONIA 32   Coagulation Profile: Recent Labs  Lab 21-Dec-2019 2011  INR 1.1   Cardiac Enzymes: Recent Labs  Lab 11/15/2019 1955 12/04/19 0353 12/05/19 0704 12/06/19 0858 12/07/19 0436  CKTOTAL 3,222* 2,231* 940* 378 288   CBG: Recent Labs  Lab 11/14/2019 1846  GLUCAP 83   Thyroid Function Tests: No results for input(s): TSH, T4TOTAL, FREET4, T3FREE, THYROIDAB in the last 72 hours. Anemia Panel: No results for input(s): VITAMINB12, FOLATE, FERRITIN, TIBC, IRON, RETICCTPCT in the last 72 hours. Sepsis Labs: Recent Labs  Lab 11/30/2019 2011 12/02/2019 2242 12/04/19 0353  PROCALCITON  --   --  0.61  LATICACIDVEN 4.5* 1.7  --     Recent Results (from the past 240 hour(s))  Blood culture (routine x 2)     Status: None (Preliminary result)   Collection Time: 11/19/2019 10:42 PM   Specimen: BLOOD  Result Value Ref Range Status   Specimen Description BLOOD LEFT HAND  Final   Special Requests   Final    BOTTLES DRAWN AEROBIC ONLY Blood Culture results may not be optimal due to an inadequate volume of blood received in culture bottles   Culture   Final    NO GROWTH 4 DAYS Performed at [facility], [address] [place], [place] [address]    Report Status PENDING  Incomplete  Blood culture (routine x 2)     Status: None (Preliminary result)   Collection Time: 11/23/2019 10:42 PM   Specimen: BLOOD  Result Value Ref Range Status   Specimen Description BLOOD LEFT ARM  Final   Special Requests   Final    BOTTLES DRAWN AEROBIC ONLY Blood Culture results may not be optimal due to an inadequate volume of blood received in culture bottles   Culture   Final    NO GROWTH 4  DAYS Performed at [facility], [address], [place], [place] [address]    Report Status PENDING  Incomplete  SARS CORONAVIRUS 2 (TAT 6-24 HRS) Nasopharyngeal Nasopharyngeal Swab     Status: None   Collection Time: 12/04/19  1:00 AM   Specimen: Nasopharyngeal Swab  Result Value Ref Range Status   SARS Coronavirus 2 NEGATIVE NEGATIVE Final    Comment: (NOTE) SARS-CoV-2 target nucleic acids are NOT DETECTED. The SARS-CoV-2 RNA is generally detectable in upper and lower respiratory specimens during the acute phase of infection. Negative results do not preclude SARS-CoV-2 infection, do not rule out co-infections with other pathogens, and should not be used as the sole basis for treatment or other patient management decisions. Negative results must be combined with clinical observations, patient history, and epidemiological information. The expected result is Negative. Fact Sheet for Patients: HairSlick.no Fact Sheet for Healthcare Providers: quierodirigir.com This test is not yet approved or cleared by the [place] FDA and  has been authorized for detection and/or diagnosis of SARS-CoV-2 by FDA under an Emergency Use Authorization (EUA). This EUA will remain  in effect (meaning this test can be used) for the duration of the COVID-19 declaration under Section 56 4(b)(1) of the Act, 21 U.S.C. section 360bbb-3(b)(1), unless the authorization is terminated or revoked sooner. Performed at [facility], [address], [place], [place] [address]       Radiology Studies: CT ABDOMEN PELVIS WO CONTRAST  Result Date: 12/06/2019 CLINICAL DATA:  Cholelithiasis, history of trauma, concern for abdominal abscess EXAM: CT ABDOMEN AND PELVIS WITHOUT CONTRAST TECHNIQUE: Multidetector CT imaging of the abdomen and pelvis was performed following the standard protocol without IV contrast. COMPARISON:  11/19/2019 FINDINGS: This  evaluation is limited by patient respiratory motion  throughout the examination. Lower chest: Diffuse bibasilar scarring and fibrosis. Interval development of small bilateral pleural effusions and patchy bibasilar atelectasis. Hepatobiliary: Gallstones are less well visualized due to respiratory motion. No evidence of cholecystitis. Liver remains unremarkable. Pancreas: No gross pancreatic abnormality. Spleen: No gross splenic abnormality. Adrenals/Urinary Tract: No urinary tract calculi or obstructive uropathy. Bladder is decompressed with a Foley catheter. The adrenals are unremarkable. Stomach/Bowel: Fecal impaction within the rectal vault unchanged. No bowel obstruction or ileus. Vascular/Lymphatic: Severe atherosclerosis of the abdominal aorta. No pathologic adenopathy. Reproductive: Stable enlarged prostate. Other: Subcutaneous edema in the bilateral flanks again noted. No free fluid within the abdomen or pelvis. No free intra-abdominal gas. Musculoskeletal: No acute displaced fractures. Reconstructed images demonstrate no additional findings. IMPRESSION: 1. Limited study due to patient motion throughout the exam. 2. Fecal impaction. 3. Small bilateral pleural effusions with bibasilar atelectasis. 4. Cholelithiasis without cholecystitis. 5. Severe atherosclerosis. Electronically Signed   By: Randa Ngo M.D.   On: 12/06/2019 01:09     Scheduled Meds: . Chlorhexidine Gluconate Cloth  6 each Topical Daily  . collagenase   Topical Daily  . docusate sodium  100 mg Oral BID  . heparin  5,000 Units Subcutaneous Q8H  . OLANZapine zydis  5 mg Oral QHS  . pantoprazole  20 mg Oral Daily  . polyethylene glycol  17 g Oral Daily  . potassium chloride  20 mEq Oral Once  . senna  1 tablet Oral Daily  . tamsulosin  0.4 mg Oral Daily   Continuous Infusions: . sodium chloride 75 mL/hr at 12/06/19 2134  . ceFEPime (MAXIPIME) IV 2 g (12/06/19 2147)  . vancomycin Stopped (12/05/19 2312)     LOS: 4 days     Time spent: 25 minutes   Bonnell Public, MD [facility] 12/07/2019, 9:10 AM   Available via Epic secure chat 7am-7pm After these hours, please refer to coverage provider listed on [email or web address]

## 2019-12-08 DIAGNOSIS — W19XXXD Unspecified fall, subsequent encounter: Secondary | ICD-10-CM

## 2019-12-08 DIAGNOSIS — J189 Pneumonia, unspecified organism: Secondary | ICD-10-CM

## 2019-12-08 DIAGNOSIS — Z7189 Other specified counseling: Secondary | ICD-10-CM

## 2019-12-08 DIAGNOSIS — N401 Enlarged prostate with lower urinary tract symptoms: Secondary | ICD-10-CM

## 2019-12-08 DIAGNOSIS — F0391 Unspecified dementia with behavioral disturbance: Secondary | ICD-10-CM

## 2019-12-08 LAB — CBC
HCT: 36.9 % — ABNORMAL LOW (ref 39.0–52.0)
Hemoglobin: 12.2 g/dL — ABNORMAL LOW (ref 13.0–17.0)
MCH: 32.1 pg (ref 26.0–34.0)
MCHC: 33.1 g/dL (ref 30.0–36.0)
MCV: 97.1 fL (ref 80.0–100.0)
Platelets: 132 10*3/uL — ABNORMAL LOW (ref 150–400)
RBC: 3.8 MIL/uL — ABNORMAL LOW (ref 4.22–5.81)
RDW: 14.6 % (ref 11.5–15.5)
WBC: 13.2 10*3/uL — ABNORMAL HIGH (ref 4.0–10.5)
nRBC: 0 % (ref 0.0–0.2)

## 2019-12-08 LAB — COMPREHENSIVE METABOLIC PANEL
ALT: 43 U/L (ref 0–44)
AST: 61 U/L — ABNORMAL HIGH (ref 15–41)
Albumin: 2.6 g/dL — ABNORMAL LOW (ref 3.5–5.0)
Alkaline Phosphatase: 57 U/L (ref 38–126)
Anion gap: 13 (ref 5–15)
BUN: 30 mg/dL — ABNORMAL HIGH (ref 8–23)
CO2: 23 mmol/L (ref 22–32)
Calcium: 8.6 mg/dL — ABNORMAL LOW (ref 8.9–10.3)
Chloride: 109 mmol/L (ref 98–111)
Creatinine, Ser: 1.48 mg/dL — ABNORMAL HIGH (ref 0.61–1.24)
GFR calc Af Amer: 47 mL/min — ABNORMAL LOW (ref >60)
GFR calc non Af Amer: 40 mL/min — ABNORMAL LOW (ref >60)
Glucose, Bld: 95 mg/dL (ref 70–99)
Potassium: 4.5 mmol/L (ref 3.5–5.1)
Sodium: 145 mmol/L (ref 135–145)
Total Bilirubin: 1.3 mg/dL — ABNORMAL HIGH (ref 0.3–1.2)
Total Protein: 6.4 g/dL — ABNORMAL LOW (ref 6.5–8.1)

## 2019-12-08 LAB — MRSA PCR SCREENING: MRSA by PCR: POSITIVE — AB

## 2019-12-08 LAB — CULTURE, BLOOD (ROUTINE X 2)
Culture: NO GROWTH
Culture: NO GROWTH

## 2019-12-08 LAB — CK: Total CK: 483 U/L — ABNORMAL HIGH (ref 49–397)

## 2019-12-08 MED ORDER — OLANZAPINE 5 MG PO TBDP
2.5000 mg | ORAL_TABLET | Freq: Every day | ORAL | Status: DC
  Administered 2019-12-08: 2.5 mg via ORAL
  Filled 2019-12-08: qty 0.5

## 2019-12-08 MED ORDER — MINERAL OIL RE ENEM
1.0000 | ENEMA | Freq: Once | RECTAL | Status: AC
  Administered 2019-12-09: 1 via RECTAL
  Filled 2019-12-08 (×2): qty 1

## 2019-12-08 NOTE — Progress Notes (Signed)
Daily Progress Note   Patient Name: Leonard Simpson       Date: 12/08/2019 DOB: Sep 22, 1924  Age: 86 y.o. MRN#: [id number] Attending Physician: Deatra James, MD Primary Care Physician: [facility] Admit Date: 14-Dec-2019  Reason for Consultation/Follow-up: Establishing goals of care  Subjective: Patient in bed, eyes closed, some increased work of breathing. Per RN notes he has not been waking to eat or drink today. He did not arouse to my voice or touch.  Called and spoke with son, Leonard Simpson. Yesterday patient able to wake and eat. Leonard Simpson asked about disposition and assistance with decision making. Discussed that PMT can help with decision making regarding goals of care, continued aggressive medical treatment vs comfort measures and disposition options for Hospice if comfort care is decided path. Otherwise then social work or care management can help as needed.  Review of Systems  Unable to perform ROS: Mental status change    Length of Stay: 5  Current Medications: Scheduled Meds:  . Chlorhexidine Gluconate Cloth  6 each Topical Daily  . collagenase   Topical Daily  . docusate sodium  100 mg Oral BID  . heparin  5,000 Units Subcutaneous Q8H  . OLANZapine zydis  5 mg Oral QHS  . pantoprazole  20 mg Oral Daily  . polyethylene glycol  17 g Oral Daily  . senna  1 tablet Oral Daily  . tamsulosin  0.4 mg Oral Daily    Continuous Infusions: . sodium chloride 75 mL/hr at 12/08/19 0329  . ceFEPime (MAXIPIME) IV 2 g (12/07/19 2150)  . vancomycin 1,000 mg (12/07/19 2252)    PRN Meds: acetaminophen **OR** acetaminophen, nitroGLYCERIN  Physical Exam Vitals and nursing note reviewed.  Constitutional:      Appearance: He is ill-appearing.  Cardiovascular:     Rate and  Rhythm: Normal rate.  Pulmonary:     Comments: Increased effort Skin:    Coloration: Skin is pale.  Neurological:     Comments: Does not wake             Vital Signs: BP (!) 139/99 (BP Location: Left Arm)   Pulse 80   Temp (!) 96.8 F (36 C) (Axillary) Comment (Src): Pt is a mouth breather. Wouldn't allow other forms of temp   Resp (!) 23   Ht 5\' 6"  (  1.676 m)   Wt 59 kg   SpO2 97%   BMI 20.98 kg/m  SpO2: SpO2: 97 % O2 Device: O2 Device: Nasal Cannula O2 Flow Rate: O2 Flow Rate (L/min): 2 L/min  Intake/output summary:   Intake/Output Summary (Last 24 hours) at 12/08/2019 1407 Last data filed at 12/08/2019 0452 Gross per 24 hour  Intake 946.99 ml  Output 1500 ml  Net -553.01 ml   LBM: Last BM Date: 12/07/19 Baseline Weight: Weight: 59 kg Most recent weight: Weight: 59 kg       Palliative Assessment/Data: PPS: 10%      Patient Active Problem List   Diagnosis Date Noted  . Palliative care encounter   . Dementia (HCC) 12/05/2019  . Wound of abdomen 12/05/2019  . Sepsis (HCC) 12/05/2019  . Rhabdomyolysis 12/04/2019  . AKI (acute kidney injury) (HCC) 12/04/2019  . Pneumonia 12/04/2019  . Acute metabolic encephalopathy 12/04/2019  . Elevated LFTs 12/04/2019  . Fall 12/04/2019  . Elevated troponin I level 12/04/2019  . BPH (benign prostatic hyperplasia) 12/04/2019  . Seasonal allergic rhinitis 01/12/2011  . Coronary artery disease   . MI (myocardial infarction) (HCC)   . Hypertension   . Hypercholesterolemia   . GERD (gastroesophageal reflux disease)   . Left ventricular dysfunction     Palliative Care Assessment & Plan   Patient Profile: Leonard Simpson is a 86 y.o. male with multiple medical problems including CAD, hypertension, hyperlipidemia, BPH, GERD, who was admitted to hospital on 12-17-2019 with altered mental status, rhabdomyolysis, and pneumonia, after being found down on the floor at home for an unknown amount of time.  Palliative care was  consulted to help address goals. Assessment/Recommendations/Plan   PMT to follow closely and continue to discuss goals of care with family  Goals of Care and Additional Recommendations:  Limitations on Scope of Treatment: Full Scope Treatment  Code Status:  DNR  Prognosis:   Unable to determine  Discharge Planning:  To Be Determined  Care plan was discussed with patient's son- Leonard Simpson.  Thank you for allowing the Palliative Medicine Team to assist in the care of this patient.   Time In: 1335 Time Out: 1415 Total Time 35 mins Prolonged Time Billed  no      Greater than 50%  of this time was spent counseling and coordinating care related to the above assessment and plan.  Ocie Bob, AGNP-C Palliative Medicine   Please contact Palliative Medicine Team phone at [phone] for questions and concerns.

## 2019-12-08 NOTE — Progress Notes (Signed)
PROGRESS NOTE    Leonard Simpson  SNK:[id number] DOB: 25-Jan-1924 DOA: 12/07/2019 PCP: [facility]     Brief Narrative:  Patient is a 86 year old male with a medical history significant for CAD, hypertension, hyperlipidemia, BPH, GERD, and what sounds like dementia.  Patient presented with altered mental status and a fall on 12/07/2019.  Patient was found by son on the floor and noted to have multiple sores all over his body.  He was confused/disoriented on presentation.  His son helps him with ADLs.  He was at his baseline 2 days prior to admission when he was last seen.  CT chest concerning for possible pneumonia, Covid testing negative.  Treatment for rhabdomyolysis with fluids was initiated in addition to vancomycin and cefepime for pneumonia.  12/06/2019: Patient seen.  Patient is resting quietly.  Patient is also restricted.  CT scan of the abdomen and pelvis revealed fecal impaction and cholelithiasis.  Will start patient on senna and Colace.  Rhabdomyolysis has resolved, will decrease IV fluids to 75 cc/h normal saline.  Acute kidney injury is resolving slowly.  We will also try soap suds enema.  Updated patient's son, Azarius.  12/07/2019: Patient seen.  Patient is better today.  We will proceed with soapsuds enema.  Updated patient's son.   12/08/2019: Patient was seen and examined this morning, remains awake, in bed, not communicating, not following any command.  Nursing staff reporting does not follow any verbal cues does not eat. Palliative care team following    Assessment & Plan:   Principal Problem:    Rhabdomyolysis  CK 2231 >> 940 >>>> 483 today   Continue normal saline 75 cc an hour.    Continue to hold statin  Monitor renal panel and hep function panel  12/06/2019: Rhabdomyolysis has resolved.  Acute kidney injury is slowly resolving.   Active Problems:   Sepsis (HCC)  Sepsis syndrome has resolved  Completed treatment for pneumonia.    Patient is currently on  IV vancomycin and cefepime.         Acute metabolic encephalopathy -with underlying dementia, worsening mental status due to multiple comorbidities   Patient is awake, remains nonverbal, poor verbal cues, poor p.o. intake   Dementia (HCC)  On Zyprexa 5 mg nightly,   Coronary artery disease/elevated troponin:  See documentation on admission (H&P).    As per H&P's documentation, this was discussed with cardiology team   Echocardiogram revealed moderate to severe aortic stenosis.  Echo is of poor quality.  Repeat echocardiogram once patient is less combative.   Fecal impaction/severe constipation:  Proceed with soapsuds enema.Burgess Estelle, positive BM  Consider repeating soapsuds enema   Hypertension  Normotensive, continue to hold home medications    Hypercholesterolemia  Hold statin    GERD (gastroesophageal reflux disease)  Protonix 20 mg daily    Elevated LFTs  Improving  2/2 rhabdo    AKI (acute kidney injury) (HCC)  Improving.   Continue to hold nephrotoxins  Decrease IV fluid to 75 cc/h.  Continue treatment renal function and electrolytes.      Pneumonia  Continue vancomycin and cefepime  Complete course of antibiotics.  Decrease IV fluids.      Fall  PT/OT    Elevated troponin I level  This was discussed with cardiology.    BPH (benign prostatic hyperplasia)  Flomax 0.4 mg daily      Wound of abdomen  Appreciate wound care team  CT abdomen and pelvis result is as documented above.  Overall, patient's prognosis is guarded.  Consult palliative care team.... Anticipating consultation to hospice, as patient oral intake continues to decline.     DVT prophylaxis: Heparin Code Status: DNR--palliative team following Family Communication: Son, Daniil updated as updated yesterday, will update him Coming From: Home Disposition Plan: SNF.  This will also depend on hospital course.  Prognosis is guarded. Patient may be appropriate for hospice  home  Barriers to Discharge: Approval, clinical improvement.... No change in mental status, poor p.o. intake, remains nonverbal,     Antimicrobials:  Anti-infectives (From admission, onward)   Start     Dose/Rate Route Frequency Ordered Stop   12/04/19 2200  ceFEPIme (MAXIPIME) 2 g in sodium chloride 0.9 % 100 mL IVPB     2 g 200 mL/hr over 30 Minutes Intravenous Every 24 hours 12/04/19 0048 12-30-2019 2359   12/01/2019 2245  vancomycin (VANCOCIN) IVPB 1000 mg/200 mL premix     1,000 mg 200 mL/hr over 60 Minutes Intravenous Every 48 hours 11/12/2019 2230 12/30/19 2359   11/13/2019 2215  ceFEPIme (MAXIPIME) 2 g in sodium chloride 0.9 % 100 mL IVPB     2 g 200 mL/hr over 30 Minutes Intravenous  Once 11/21/2019 2207 12/04/19 0258       Objective: Vitals:   12/07/19 0458 12/07/19 0757 12/07/19 2017 12/08/19 0450  BP: (!) 144/74 (!) 156/100 139/85 (!) 139/99  Pulse: 76 (!) 104 (!) 109 80  Resp: 20 20 (!) 21 (!) 23  Temp: 97.7 F (36.5 C) 97.6 F (36.4 C) 97.6 F (36.4 C) (!) 96.8 F (36 C)  TempSrc: Oral Oral Oral Axillary  SpO2:   92% 97%  Weight:      Height:        Intake/Output Summary (Last 24 hours) at 12/08/2019 1153 Last data filed at 12/08/2019 0452 Gross per 24 hour  Intake 946.99 ml  Output 2300 ml  Net -1353.01 ml   Filed Weights   11/11/2019 1850  Weight: 59 kg    Examination:  General exam: Seen the patient for first time, awake but not following any command, nonverbal per nursing staff not taking anything oral,  Respiratory system: Clear to auscultation bilaterally, 2 L cannula Cardiovascular system: S1, S2 Gastrointestinal system: Abdomen is nondistended, soft and nontender. Normal bowel sounds heard. Central nervous system: Laying in bed, awake does not follow any command, nonverbal Extremities: No leg edema.  Data Reviewed: I have personally reviewed following labs and imaging studies  CBC: Recent Labs  Lab 12/04/19 0353 12/05/19 0704 12/06/19 0858  12/07/19 0436 12/08/19 0235  WBC 15.7* 9.6 10.1 10.5 13.2*  HGB 11.6* 10.9* 10.7* 11.9* 12.2*  HCT 35.6* 33.4* 33.1* 36.9* 36.9*  MCV 98.1 97.9 99.4 97.6 97.1  PLT 112* 96* 99* 130* 941*   Basic Metabolic Panel: Recent Labs  Lab 11/24/2019 1955 12/04/19 0103 12/04/19 0353 12/05/19 0704 12/06/19 0858 12/07/19 0436 12/08/19 0648  NA   < >  --  142 143 139 141 145  K   < >  --  3.9 3.7 4.2 3.4* 4.5  CL   < >  --  110 109 108 106 109  CO2   < >  --  22 24 22 22 23   GLUCOSE   < >  --  119* 81 85 85 95  BUN   < >  --  45* 45* 36* 28* 30*  CREATININE   < >  --  1.92* 1.91* 1.57* 1.59* 1.48*  CALCIUM   < >  --  7.7* 8.5* 8.2* 8.5* 8.6*  MG  --  1.9  --   --   --   --   --    < > = values in this interval not displayed.   GFR: Estimated Creatinine Clearance: 26 mL/min (A) (by C-G formula based on SCr of 1.48 mg/dL (H)). Liver Function Tests: Recent Labs  Lab 12/04/19 0353 12/05/19 0704 12/06/19 0858 12/07/19 0436 12/08/19 0648  AST 115* 79* 58* 55* 61*  ALT 40 41 39 41 43  ALKPHOS 43 48 41 52 57  BILITOT 0.9 1.1 1.3* 1.7* 1.3*  PROT 5.4* 5.4* 5.2* 6.3* 6.4*  ALBUMIN 2.4* 2.3* 2.2* 2.5* 2.6*   Recent Labs  Lab 12/04/19 0106  AMMONIA 32   Coagulation Profile: Recent Labs  Lab 11/11/2019 2011  INR 1.1   Cardiac Enzymes: Recent Labs  Lab 12/04/19 0353 12/05/19 0704 12/06/19 0858 12/07/19 0436 12/08/19 0648  CKTOTAL 2,231* 940* 378 288 483*   CBG: Recent Labs  Lab 11/21/2019 1846  GLUCAP 83   Thyroid Function Tests: No results for input(s): TSH, T4TOTAL, FREET4, T3FREE, THYROIDAB in the last 72 hours. Anemia Panel: No results for input(s): VITAMINB12, FOLATE, FERRITIN, TIBC, IRON, RETICCTPCT in the last 72 hours. Sepsis Labs: Recent Labs  Lab 12/06/2019 2011 11/29/2019 2242 12/04/19 0353  PROCALCITON  --   --  0.61  LATICACIDVEN 4.5* 1.7  --     Recent Results (from the past 240 hour(s))  Blood culture (routine x 2)     Status: None   Collection Time:  12/01/2019 10:42 PM   Specimen: BLOOD  Result Value Ref Range Status   Specimen Description BLOOD LEFT HAND  Final   Special Requests   Final    BOTTLES DRAWN AEROBIC ONLY Blood Culture results may not be optimal due to an inadequate volume of blood received in culture bottles   Culture   Final    NO GROWTH 5 DAYS Performed at [facility], [address] [place], [place] [address]    Report Status 12/08/2019 FINAL  Final  Blood culture (routine x 2)     Status: None   Collection Time: 11/30/2019 10:42 PM   Specimen: BLOOD  Result Value Ref Range Status   Specimen Description BLOOD LEFT ARM  Final   Special Requests   Final    BOTTLES DRAWN AEROBIC ONLY Blood Culture results may not be optimal due to an inadequate volume of blood received in culture bottles   Culture   Final    NO GROWTH 5 DAYS Performed at [facility], [address], [place], [place] [address]    Report Status 12/08/2019 FINAL  Final  SARS CORONAVIRUS 2 (TAT 6-24 HRS) Nasopharyngeal Nasopharyngeal Swab     Status: None   Collection Time: 12/04/19  1:00 AM   Specimen: Nasopharyngeal Swab  Result Value Ref Range Status   SARS Coronavirus 2 NEGATIVE NEGATIVE Final    Comment: (NOTE) SARS-CoV-2 target nucleic acids are NOT DETECTED. The SARS-CoV-2 RNA is generally detectable in upper and lower respiratory specimens during the acute phase of infection. Negative results do not preclude SARS-CoV-2 infection, do not rule out co-infections with other pathogens, and should not be used as the sole basis for treatment or other patient management decisions. Negative results must be combined with clinical observations, patient history, and epidemiological information. The expected result is Negative. Fact Sheet for Patients: HairSlick.no Fact Sheet for Healthcare Providers: quierodirigir.com This test is not yet approved  or cleared by the [place]  and  has been authorized for detection and/or diagnosis of SARS-CoV-2 by FDA under an Emergency Use Authorization (EUA). This EUA will remain  in effect (meaning this test can be used) for the duration of the COVID-19 declaration under Section 56 4(b)(1) of the Act, 21 U.S.C. section 360bbb-3(b)(1), unless the authorization is terminated or revoked sooner. Performed at [facility], [address], [place], [place] [address]       Radiology Studies: DG Abd Portable 1V  Result Date: 12/07/2019 CLINICAL DATA:  Fecal impaction. EXAM: PORTABLE ABDOMEN - 1 VIEW COMPARISON:  CT scan 12/06/2019 FINDINGS: 0916 hours. No gaseous small bowel or colonic dilatation. Prominent stool volume noted in the rectum. Calcified gallstones noted right upper quadrant, better characterized on recent ultrasound. Bones are diffusely demineralized. Numerous phleboliths are seen over the atomic pelvis. IMPRESSION: Prominent rectal stool volume. No small bowel or colonic dilatation. Electronically Signed   By: Kennith Center M.D.   On: 12/07/2019 09:50     Scheduled Meds: . Chlorhexidine Gluconate Cloth  6 each Topical Daily  . collagenase   Topical Daily  . docusate sodium  100 mg Oral BID  . heparin  5,000 Units Subcutaneous Q8H  . OLANZapine zydis  5 mg Oral QHS  . pantoprazole  20 mg Oral Daily  . polyethylene glycol  17 g Oral Daily  . senna  1 tablet Oral Daily  . tamsulosin  0.4 mg Oral Daily   Continuous Infusions: . sodium chloride 75 mL/hr at 12/08/19 0329  . ceFEPime (MAXIPIME) IV 2 g (12/07/19 2150)  . vancomycin 1,000 mg (12/07/19 2252)     LOS: 5 days    Time spent: 25 minutes   Kendell Bane, MD [facility] 12/08/2019, 11:53 AM   Available via Epic secure chat 7am-7pm After these hours, please refer to coverage provider listed on [email or web address]

## 2019-12-08 DEATH — deceased

## 2019-12-09 ENCOUNTER — Inpatient Hospital Stay (HOSPITAL_COMMUNITY): Payer: Medicare Other

## 2019-12-09 DIAGNOSIS — J810 Acute pulmonary edema: Secondary | ICD-10-CM

## 2019-12-09 DIAGNOSIS — Z7189 Other specified counseling: Secondary | ICD-10-CM

## 2019-12-09 DIAGNOSIS — Z515 Encounter for palliative care: Secondary | ICD-10-CM

## 2019-12-09 LAB — CK: Total CK: 276 U/L (ref 49–397)

## 2019-12-09 LAB — COMPREHENSIVE METABOLIC PANEL
ALT: 40 U/L (ref 0–44)
AST: 43 U/L — ABNORMAL HIGH (ref 15–41)
Albumin: 2.5 g/dL — ABNORMAL LOW (ref 3.5–5.0)
Alkaline Phosphatase: 61 U/L (ref 38–126)
Anion gap: 11 (ref 5–15)
BUN: 41 mg/dL — ABNORMAL HIGH (ref 8–23)
CO2: 23 mmol/L (ref 22–32)
Calcium: 8.5 mg/dL — ABNORMAL LOW (ref 8.9–10.3)
Chloride: 112 mmol/L — ABNORMAL HIGH (ref 98–111)
Creatinine, Ser: 1.79 mg/dL — ABNORMAL HIGH (ref 0.61–1.24)
GFR calc Af Amer: 37 mL/min — ABNORMAL LOW (ref >60)
GFR calc non Af Amer: 32 mL/min — ABNORMAL LOW (ref >60)
Glucose, Bld: 111 mg/dL — ABNORMAL HIGH (ref 70–99)
Potassium: 4.9 mmol/L (ref 3.5–5.1)
Sodium: 146 mmol/L — ABNORMAL HIGH (ref 135–145)
Total Bilirubin: 0.9 mg/dL (ref 0.3–1.2)
Total Protein: 6.3 g/dL — ABNORMAL LOW (ref 6.5–8.1)

## 2019-12-09 LAB — CBC
HCT: 39.7 % (ref 39.0–52.0)
Hemoglobin: 12.5 g/dL — ABNORMAL LOW (ref 13.0–17.0)
MCH: 31.7 pg (ref 26.0–34.0)
MCHC: 31.5 g/dL (ref 30.0–36.0)
MCV: 100.8 fL — ABNORMAL HIGH (ref 80.0–100.0)
Platelets: 151 10*3/uL (ref 150–400)
RBC: 3.94 MIL/uL — ABNORMAL LOW (ref 4.22–5.81)
RDW: 14.8 % (ref 11.5–15.5)
WBC: 12.9 10*3/uL — ABNORMAL HIGH (ref 4.0–10.5)
nRBC: 0 % (ref 0.0–0.2)

## 2019-12-09 MED ORDER — HALOPERIDOL LACTATE 5 MG/ML IJ SOLN: 0.5000 mg | INTRAMUSCULAR | Status: DC | PRN

## 2019-12-09 MED ORDER — GLYCOPYRROLATE 0.2 MG/ML IJ SOLN
0.2000 mg | INTRAMUSCULAR | Status: DC | PRN
  Administered 2019-12-09: 0.2 mg via INTRAVENOUS
  Filled 2019-12-09: qty 1

## 2019-12-09 MED ORDER — GLYCOPYRROLATE 0.2 MG/ML IJ SOLN: 0.2000 mg | INTRAMUSCULAR | Status: DC | PRN

## 2019-12-09 MED ORDER — MORPHINE SULFATE (PF) 2 MG/ML IV SOLN: 2.0000 mg | INTRAVENOUS | Status: DC | PRN

## 2019-12-09 MED ORDER — FUROSEMIDE 10 MG/ML IJ SOLN
40.0000 mg | Freq: Once | INTRAMUSCULAR | Status: AC
  Administered 2019-12-09: 40 mg via INTRAVENOUS
  Filled 2019-12-09: qty 4

## 2019-12-09 MED ORDER — LORAZEPAM 2 MG/ML IJ SOLN
1.0000 mg | Freq: Four times a day (QID) | INTRAMUSCULAR | Status: DC
  Administered 2019-12-09: 12:00:00 1 mg via INTRAVENOUS
  Filled 2019-12-09: qty 1

## 2019-12-09 MED ORDER — ONDANSETRON 4 MG PO TBDP: 4.0000 mg | ORAL_TABLET | Freq: Four times a day (QID) | ORAL | Status: DC | PRN

## 2019-12-09 MED ORDER — MORPHINE SULFATE (PF) 2 MG/ML IV SOLN
2.0000 mg | INTRAVENOUS | Status: DC
  Administered 2019-12-09: 2 mg via INTRAVENOUS
  Filled 2019-12-09: qty 1

## 2019-12-09 MED ORDER — HALOPERIDOL 0.5 MG PO TABS
0.5000 mg | ORAL_TABLET | ORAL | Status: DC | PRN
  Filled 2019-12-09: qty 1

## 2019-12-09 MED ORDER — ONDANSETRON HCL 4 MG/2ML IJ SOLN: 4.0000 mg | Freq: Four times a day (QID) | INTRAMUSCULAR | Status: DC | PRN

## 2019-12-09 MED ORDER — MORPHINE SULFATE (PF) 2 MG/ML IV SOLN
1.0000 mg | INTRAVENOUS | Status: DC | PRN
  Administered 2019-12-09: 1 mg via INTRAVENOUS
  Filled 2019-12-09: qty 1

## 2019-12-09 MED ORDER — HALOPERIDOL LACTATE 2 MG/ML PO CONC
0.5000 mg | ORAL | Status: DC | PRN
  Filled 2019-12-09: qty 0.3

## 2019-12-09 MED ORDER — POLYVINYL ALCOHOL 1.4 % OP SOLN
1.0000 [drp] | Freq: Four times a day (QID) | OPHTHALMIC | Status: DC | PRN
  Filled 2019-12-09: qty 15

## 2019-12-09 MED ORDER — GLYCOPYRROLATE 1 MG PO TABS
1.0000 mg | ORAL_TABLET | ORAL | Status: DC | PRN
  Filled 2019-12-09: qty 1

## 2020-01-08 NOTE — TOC Transition Note (Signed)
Transition of Care [facility]) - CM/SW Discharge Note Donn Pierini RN, BSN Transitions of Care Unit 4E- RN Case Manager [phone]   Patient Details  Name: Semir Brill MRN: [id number] Date of Birth: 1924-08-12  [facility]) CM/SW Contact:  Darrold Span, RN Phone Number: 12/30/19, 2:55 PM   Clinical Narrative:    Noted referral for Residential hospice- however PC f/u this AM- pt made full comfort care here and noted to have expired at 1257.    Final next level of care: Expired Barriers to Discharge: Barriers Resolved   Patient Goals and CMS Choice     Choice offered to / list presented to : Adult Children  Discharge Placement               Expired        Discharge Plan and Services                                     Social Determinants of Health (SDOH) Interventions     Readmission Risk Interventions No flowsheet data found.

## 2020-01-08 NOTE — Discharge Summary (Signed)
Death Summary  Edson Deridder VWU:[id number] DOB: 12-18-1923 DOA: December 14, 2019  PCP: [facility]  Admit date: 2019-12-14 Date of Death: Dec 20, 2019 Time of Death: 02-25-56 Notification: [facility] notified of death of 12/20/19   History of present illness:  86 year old male with a medical history significant for CAD, hypertension, hyperlipidemia, BPH, GERD, and what sounds like dementia.  Patient presented with altered mental status and a fall on 14-Dec-2019. Patient was found by son on the floor and noted to have multiple sores all over his body. He was confused/disoriented on presentation. His son helps him with ADLs. He was at his baseline 2 days prior to admission when he was last seen. CT chest concerning for possible pneumonia, Covid testing negative. Treatment for rhabdomyolysis with fluidswas initiated in addition to vancomycin and cefepime for pneumonia.  Final Diagnoses:  Principal Problem:    Rhabdomyolysis             Active Problems:   Sepsis (HCC)     Acute metabolic encephalopathy -with underlying dementia, worsening mental status due to multiple comorbidities    Dementia (HCC)  Coronary artery disease/elevated troponin:              Fecal impaction/severe constipation:  Hypertension  HLD    GERD (gastroesophageal reflux disease)    Elevated LFTs    AKI (acute kidney injury) (HCC)    Pneumonia    Fall    Elevated troponin I level    BPH (benign prostatic hyperplasia)     Wound of abdomen  Please see presenting H&P.  Patient presented with mental status change and fall.  Patient noted to have rhabdomyolysis with markedly elevated CK.  Patient was continued on IV fluid hydration with gradual improvement in CK levels.  Patient noted to have acute renal failure which also gradually improved with IV fluid hydration.  During his course, patient was found to have evidence of pneumonia and completed course of vancomycin and  cefepime.  Despite aggressive care, patient's condition remained guarded.  Palliative care consult was requested..  Patient noted to be DO NOT RESUSCITATE at time of presentation.  Sadly, despite appropriate treatment, patient continued to decompensate.  On the morning of 12/20/2019, patient noted to have increased O2 requirements needing up to nonrebreather.  Chest x-ray obtained was personally reviewed, suggesting edema.  Patient given trial of Lasix however patient did not improve.  Concerned that patient was actively dying at this time.  Palliative care followed up on patient with another family meeting taking place.  At this point, patient's wishes were for transition to full comfort.  At 1257, patient was pronounced.   The results of significant diagnostics from this hospitalization (including imaging, microbiology, ancillary and laboratory) are listed below for reference.    Significant Diagnostic Studies: CT ABDOMEN PELVIS WO CONTRAST  Result Date: 12/06/2019 CLINICAL DATA:  Cholelithiasis, history of trauma, concern for abdominal abscess EXAM: CT ABDOMEN AND PELVIS WITHOUT CONTRAST TECHNIQUE: Multidetector CT imaging of the abdomen and pelvis was performed following the standard protocol without IV contrast. COMPARISON:  12/14/2019 FINDINGS: This evaluation is limited by patient respiratory motion throughout the examination. Lower chest: Diffuse bibasilar scarring and fibrosis. Interval development of small bilateral pleural effusions and patchy bibasilar atelectasis. Hepatobiliary: Gallstones are less well visualized due to respiratory motion. No evidence of cholecystitis. Liver remains unremarkable. Pancreas: No gross pancreatic abnormality. Spleen: No gross splenic abnormality. Adrenals/Urinary Tract: No urinary tract calculi or obstructive uropathy. Bladder is decompressed with a Foley catheter. The  adrenals are unremarkable. Stomach/Bowel: Fecal impaction within the rectal vault unchanged. No  bowel obstruction or ileus. Vascular/Lymphatic: Severe atherosclerosis of the abdominal aorta. No pathologic adenopathy. Reproductive: Stable enlarged prostate. Other: Subcutaneous edema in the bilateral flanks again noted. No free fluid within the abdomen or pelvis. No free intra-abdominal gas. Musculoskeletal: No acute displaced fractures. Reconstructed images demonstrate no additional findings. IMPRESSION: 1. Limited study due to patient motion throughout the exam. 2. Fecal impaction. 3. Small bilateral pleural effusions with bibasilar atelectasis. 4. Cholelithiasis without cholecystitis. 5. Severe atherosclerosis. Electronically Signed   By: Sharlet Salina M.D.   On: 12/06/2019 01:09   CT ABDOMEN PELVIS WO CONTRAST  Result Date: 11/18/2019 CLINICAL DATA:  Trauma. EXAM: CT HEAD WITHOUT CONTRAST CT MAXILLOFACIAL WITHOUT CONTRAST CT CERVICAL SPINE WITHOUT CONTRAST CT CHEST, ABDOMEN AND PELVIS WITHOUT CONTRAST TECHNIQUE: Contiguous axial images were obtained from the base of the skull through the vertex without intravenous contrast. Multidetector CT imaging of the maxillofacial structures was performed. Multiplanar CT image reconstructions were also generated. A small metallic BB was placed on the right temple in order to reliably differentiate right from left. Multidetector CT imaging of the cervical spine was performed without intravenous contrast. Multiplanar CT image reconstructions were also generated. Multidetector CT imaging of the chest, abdomen and pelvis was performed following the standard protocol without IV contrast. COMPARISON:  None. FINDINGS: CT HEAD FINDINGS Brain: No evidence of acute infarction, hemorrhage, hydrocephalus, extra-axial collection or mass lesion/mass effect. There is extensive volume loss in addition to extensive chronic microvascular ischemic changes. 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 Vascular: No hyperdense vessel or unexpected calcification. Skull:  Normal. Negative for fracture or focal lesion. Other: None. CT MAXILLOFACIAL FINDINGS Osseous: The evaluation of the osseous structures is significantly limited by motion artifact. Given this limitation, there is no definite acute osseous abnormality. Orbits: Negative. No traumatic or inflammatory finding. Sinuses: Clear. Soft tissues: Negative. CT CERVICAL SPINE FINDINGS Alignment: Normal. Skull base and vertebrae: No acute fracture. No primary bone lesion or focal pathologic process. Soft tissues and spinal canal: No prevertebral fluid or swelling. No visible canal hematoma. Disc levels: Multilevel degenerative changes are noted throughout the cervical spine, greatest at the C5-C6 and C6-C7 levels. Other: None. CT CHEST FINDINGS Cardiovascular: Heart size is normal. Advanced atherosclerotic changes are noted of the thoracic aorta and coronary arteries. There is no significant pericardial effusion. Mediastinum/Nodes: --No mediastinal or hilar lymphadenopathy. --No axillary lymphadenopathy. --No supraclavicular lymphadenopathy. --Normal thyroid gland. --The esophagus is unremarkable Lungs/Pleura: There are areas of honeycombing involving the lung bases and lung apices. There are coarse reticular lung markings bilaterally. There is a somewhat masslike airspace opacity involving the right upper lobe measuring approximately 1.8 x 2.2 cm (axial series 5, image 57). The trachea is unremarkable. There is no pneumothorax or significant pleural effusion. The left hemidiaphragm is elevated. Musculoskeletal: No chest wall abnormality. No acute or significant osseous findings. CT ABDOMEN AND PELVIS FINDINGS Hepatobiliary: The liver is normal. Cholelithiasis without acute inflammation.The common bile duct is dilated measuring up to approximately 1.4 cm. Pancreas: Normal contours without ductal dilatation. No peripancreatic fluid collection. Spleen: No splenic laceration or hematoma. Adrenals/Urinary Tract: --Adrenal glands: No  adrenal hemorrhage. --Right kidney/ureter: No hydronephrosis or perinephric hematoma. --Left kidney/ureter: No hydronephrosis or perinephric hematoma. --Urinary bladder: The bladder is decompressed with a Foley catheter. Stomach/Bowel: --Stomach/Duodenum: No hiatal hernia or other gastric abnormality. Normal duodenal course and caliber. --Small bowel: No dilatation or inflammation. --Colon: There is a large amount  of well-formed stool at the level of the rectum. There is a moderate amount of stool throughout the remaining portions of the colon. --Appendix: Not visualized. No right lower quadrant inflammation or free fluid. Vascular/Lymphatic: Advanced vascular calcifications are noted. The infrarenal abdominal aorta measures up to approximately 2.8 cm in diameter. --No retroperitoneal lymphadenopathy. --No mesenteric lymphadenopathy. --No pelvic or inguinal lymphadenopathy. Reproductive: The prostate gland is enlarged. Other: No ascites or free air. There is mild body wall edema. Musculoskeletal. No acute displaced fractures. IMPRESSION: 1. No acute intracranial abnormality. Advanced atrophy and chronic microvascular ischemic changes are noted. 2. No acute cervical spine fracture. 3. No acute facial bone fracture, however evaluation was limited by significant motion artifact. 4. Multiple lung findings are noted as detailed above with differential considerations fibrotic lung disease with a superimposed atypical infectious process such as viral pneumonia not excluded. 5. There is a masslike airspace opacity in the right upper lobe as detailed above. This may represent an area of consolidation, however an underlying mass is not excluded. A three-month follow-up CT of the chest is recommended. 6. Ectatic abdominal aorta at risk for aneurysm development, currently measuring 2.8 cm. Recommend followup by ultrasound in 5 years. This recommendation follows ACR consensus guidelines: White Paper of the ACR Incidental  Findings Committee II on Vascular Findings. J Am Coll Radiol 2013; 10:789-794. Aortic aneurysm NOS (ICD10-I71.9) 7. Cholelithiasis with dilated common bile duct measuring up to 1.4 cm. No CT evidence of acute cholecystitis. If there is clinical concern for acute biliary pathology, recommend further evaluation with MRCP with the patient is clinically stable. An emergent MRCP would likely be degraded by significant motion artifact. 8. Large amount of well-formed stool at the level of the rectum. This may represent fecal impaction. 9. Aortic Atherosclerosis (ICD10-I70.0). Electronically Signed   By: Katherine Mantle M.D.   On: 11/28/2019 21:17   DG Abd 1 View  Result Date: December 26, 2019 CLINICAL DATA:  Constipation EXAM: ABDOMEN - 1 VIEW COMPARISON:  12/07/2019 FINDINGS: Calcified gallstones RIGHT upper quadrant. Numerous calcified phleboliths in pelvis. Normal bowel gas pattern with normal stool burden. No bowel dilatation or bowel wall thickening. Bones demineralized. Scattered atherosclerotic calcifications. IMPRESSION: Normal bowel gas pattern. Cholelithiasis. Electronically Signed   By: Ulyses Southward M.D.   On: 26-Dec-2019 08:59   CT Head Wo Contrast  Result Date: 11/20/2019 CLINICAL DATA:  Trauma. EXAM: CT HEAD WITHOUT CONTRAST CT MAXILLOFACIAL WITHOUT CONTRAST CT CERVICAL SPINE WITHOUT CONTRAST CT CHEST, ABDOMEN AND PELVIS WITHOUT CONTRAST TECHNIQUE: Contiguous axial images were obtained from the base of the skull through the vertex without intravenous contrast. Multidetector CT imaging of the maxillofacial structures was performed. Multiplanar CT image reconstructions were also generated. A small metallic BB was placed on the right temple in order to reliably differentiate right from left. Multidetector CT imaging of the cervical spine was performed without intravenous contrast. Multiplanar CT image reconstructions were also generated. Multidetector CT imaging of the chest, abdomen and pelvis was performed  following the standard protocol without IV contrast. COMPARISON:  None. FINDINGS: CT HEAD FINDINGS Brain: No evidence of acute infarction, hemorrhage, hydrocephalus, extra-axial collection or mass lesion/mass effect. There is extensive volume loss in addition to extensive chronic microvascular ischemic changes. 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 Vascular: No hyperdense vessel or unexpected calcification. Skull: Normal. Negative for fracture or focal lesion. Other: None. CT MAXILLOFACIAL FINDINGS Osseous: The evaluation of the osseous structures is significantly limited by motion artifact. Given this limitation, there is no definite  acute osseous abnormality. Orbits: Negative. No traumatic or inflammatory finding. Sinuses: Clear. Soft tissues: Negative. CT CERVICAL SPINE FINDINGS Alignment: Normal. Skull base and vertebrae: No acute fracture. No primary bone lesion or focal pathologic process. Soft tissues and spinal canal: No prevertebral fluid or swelling. No visible canal hematoma. Disc levels: Multilevel degenerative changes are noted throughout the cervical spine, greatest at the C5-C6 and C6-C7 levels. Other: None. CT CHEST FINDINGS Cardiovascular: Heart size is normal. Advanced atherosclerotic changes are noted of the thoracic aorta and coronary arteries. There is no significant pericardial effusion. Mediastinum/Nodes: --No mediastinal or hilar lymphadenopathy. --No axillary lymphadenopathy. --No supraclavicular lymphadenopathy. --Normal thyroid gland. --The esophagus is unremarkable Lungs/Pleura: There are areas of honeycombing involving the lung bases and lung apices. There are coarse reticular lung markings bilaterally. There is a somewhat masslike airspace opacity involving the right upper lobe measuring approximately 1.8 x 2.2 cm (axial series 5, image 57). The trachea is unremarkable. There is no pneumothorax or significant pleural effusion. The left hemidiaphragm is elevated.  Musculoskeletal: No chest wall abnormality. No acute or significant osseous findings. CT ABDOMEN AND PELVIS FINDINGS Hepatobiliary: The liver is normal. Cholelithiasis without acute inflammation.The common bile duct is dilated measuring up to approximately 1.4 cm. Pancreas: Normal contours without ductal dilatation. No peripancreatic fluid collection. Spleen: No splenic laceration or hematoma. Adrenals/Urinary Tract: --Adrenal glands: No adrenal hemorrhage. --Right kidney/ureter: No hydronephrosis or perinephric hematoma. --Left kidney/ureter: No hydronephrosis or perinephric hematoma. --Urinary bladder: The bladder is decompressed with a Foley catheter. Stomach/Bowel: --Stomach/Duodenum: No hiatal hernia or other gastric abnormality. Normal duodenal course and caliber. --Small bowel: No dilatation or inflammation. --Colon: There is a large amount of well-formed stool at the level of the rectum. There is a moderate amount of stool throughout the remaining portions of the colon. --Appendix: Not visualized. No right lower quadrant inflammation or free fluid. Vascular/Lymphatic: Advanced vascular calcifications are noted. The infrarenal abdominal aorta measures up to approximately 2.8 cm in diameter. --No retroperitoneal lymphadenopathy. --No mesenteric lymphadenopathy. --No pelvic or inguinal lymphadenopathy. Reproductive: The prostate gland is enlarged. Other: No ascites or free air. There is mild body wall edema. Musculoskeletal. No acute displaced fractures. IMPRESSION: 1. No acute intracranial abnormality. Advanced atrophy and chronic microvascular ischemic changes are noted. 2. No acute cervical spine fracture. 3. No acute facial bone fracture, however evaluation was limited by significant motion artifact. 4. Multiple lung findings are noted as detailed above with differential considerations fibrotic lung disease with a superimposed atypical infectious process such as viral pneumonia not excluded. 5. There is a  masslike airspace opacity in the right upper lobe as detailed above. This may represent an area of consolidation, however an underlying mass is not excluded. A three-month follow-up CT of the chest is recommended. 6. Ectatic abdominal aorta at risk for aneurysm development, currently measuring 2.8 cm. Recommend followup by ultrasound in 5 years. This recommendation follows ACR consensus guidelines: White Paper of the ACR Incidental Findings Committee II on Vascular Findings. J Am Coll Radiol 2013; 10:789-794. Aortic aneurysm NOS (ICD10-I71.9) 7. Cholelithiasis with dilated common bile duct measuring up to 1.4 cm. No CT evidence of acute cholecystitis. If there is clinical concern for acute biliary pathology, recommend further evaluation with MRCP with the patient is clinically stable. An emergent MRCP would likely be degraded by significant motion artifact. 8. Large amount of well-formed stool at the level of the rectum. This may represent fecal impaction. 9. Aortic Atherosclerosis (ICD10-I70.0). Electronically Signed   By: Beryle Quant.D.  On: 11/17/2019 21:17   CT CHEST WO CONTRAST  Result Date: 12/05/2019 CLINICAL DATA:  Trauma. EXAM: CT HEAD WITHOUT CONTRAST CT MAXILLOFACIAL WITHOUT CONTRAST CT CERVICAL SPINE WITHOUT CONTRAST CT CHEST, ABDOMEN AND PELVIS WITHOUT CONTRAST TECHNIQUE: Contiguous axial images were obtained from the base of the skull through the vertex without intravenous contrast. Multidetector CT imaging of the maxillofacial structures was performed. Multiplanar CT image reconstructions were also generated. A small metallic BB was placed on the right temple in order to reliably differentiate right from left. Multidetector CT imaging of the cervical spine was performed without intravenous contrast. Multiplanar CT image reconstructions were also generated. Multidetector CT imaging of the chest, abdomen and pelvis was performed following the standard protocol without IV contrast.  COMPARISON:  None. FINDINGS: CT HEAD FINDINGS Brain: No evidence of acute infarction, hemorrhage, hydrocephalus, extra-axial collection or mass lesion/mass effect. There is extensive volume loss in addition to extensive chronic microvascular ischemic changes. 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 Vascular: No hyperdense vessel or unexpected calcification. Skull: Normal. Negative for fracture or focal lesion. Other: None. CT MAXILLOFACIAL FINDINGS Osseous: The evaluation of the osseous structures is significantly limited by motion artifact. Given this limitation, there is no definite acute osseous abnormality. Orbits: Negative. No traumatic or inflammatory finding. Sinuses: Clear. Soft tissues: Negative. CT CERVICAL SPINE FINDINGS Alignment: Normal. Skull base and vertebrae: No acute fracture. No primary bone lesion or focal pathologic process. Soft tissues and spinal canal: No prevertebral fluid or swelling. No visible canal hematoma. Disc levels: Multilevel degenerative changes are noted throughout the cervical spine, greatest at the C5-C6 and C6-C7 levels. Other: None. CT CHEST FINDINGS Cardiovascular: Heart size is normal. Advanced atherosclerotic changes are noted of the thoracic aorta and coronary arteries. There is no significant pericardial effusion. Mediastinum/Nodes: --No mediastinal or hilar lymphadenopathy. --No axillary lymphadenopathy. --No supraclavicular lymphadenopathy. --Normal thyroid gland. --The esophagus is unremarkable Lungs/Pleura: There are areas of honeycombing involving the lung bases and lung apices. There are coarse reticular lung markings bilaterally. There is a somewhat masslike airspace opacity involving the right upper lobe measuring approximately 1.8 x 2.2 cm (axial series 5, image 57). The trachea is unremarkable. There is no pneumothorax or significant pleural effusion. The left hemidiaphragm is elevated. Musculoskeletal: No chest wall abnormality. No acute or  significant osseous findings. CT ABDOMEN AND PELVIS FINDINGS Hepatobiliary: The liver is normal. Cholelithiasis without acute inflammation.The common bile duct is dilated measuring up to approximately 1.4 cm. Pancreas: Normal contours without ductal dilatation. No peripancreatic fluid collection. Spleen: No splenic laceration or hematoma. Adrenals/Urinary Tract: --Adrenal glands: No adrenal hemorrhage. --Right kidney/ureter: No hydronephrosis or perinephric hematoma. --Left kidney/ureter: No hydronephrosis or perinephric hematoma. --Urinary bladder: The bladder is decompressed with a Foley catheter. Stomach/Bowel: --Stomach/Duodenum: No hiatal hernia or other gastric abnormality. Normal duodenal course and caliber. --Small bowel: No dilatation or inflammation. --Colon: There is a large amount of well-formed stool at the level of the rectum. There is a moderate amount of stool throughout the remaining portions of the colon. --Appendix: Not visualized. No right lower quadrant inflammation or free fluid. Vascular/Lymphatic: Advanced vascular calcifications are noted. The infrarenal abdominal aorta measures up to approximately 2.8 cm in diameter. --No retroperitoneal lymphadenopathy. --No mesenteric lymphadenopathy. --No pelvic or inguinal lymphadenopathy. Reproductive: The prostate gland is enlarged. Other: No ascites or free air. There is mild body wall edema. Musculoskeletal. No acute displaced fractures. IMPRESSION: 1. No acute intracranial abnormality. Advanced atrophy and chronic microvascular ischemic changes are noted. 2. No acute  cervical spine fracture. 3. No acute facial bone fracture, however evaluation was limited by significant motion artifact. 4. Multiple lung findings are noted as detailed above with differential considerations fibrotic lung disease with a superimposed atypical infectious process such as viral pneumonia not excluded. 5. There is a masslike airspace opacity in the right upper lobe as  detailed above. This may represent an area of consolidation, however an underlying mass is not excluded. A three-month follow-up CT of the chest is recommended. 6. Ectatic abdominal aorta at risk for aneurysm development, currently measuring 2.8 cm. Recommend followup by ultrasound in 5 years. This recommendation follows ACR consensus guidelines: White Paper of the ACR Incidental Findings Committee II on Vascular Findings. J Am Coll Radiol 2013; 10:789-794. Aortic aneurysm NOS (ICD10-I71.9) 7. Cholelithiasis with dilated common bile duct measuring up to 1.4 cm. No CT evidence of acute cholecystitis. If there is clinical concern for acute biliary pathology, recommend further evaluation with MRCP with the patient is clinically stable. An emergent MRCP would likely be degraded by significant motion artifact. 8. Large amount of well-formed stool at the level of the rectum. This may represent fecal impaction. 9. Aortic Atherosclerosis (ICD10-I70.0). Electronically Signed   By: Katherine Mantlehristopher  Green M.D.   On: 09-13-20 21:17   CT Cervical Spine Wo Contrast  Result Date: 08-16-20 CLINICAL DATA:  Trauma. EXAM: CT HEAD WITHOUT CONTRAST CT MAXILLOFACIAL WITHOUT CONTRAST CT CERVICAL SPINE WITHOUT CONTRAST CT CHEST, ABDOMEN AND PELVIS WITHOUT CONTRAST TECHNIQUE: Contiguous axial images were obtained from the base of the skull through the vertex without intravenous contrast. Multidetector CT imaging of the maxillofacial structures was performed. Multiplanar CT image reconstructions were also generated. A small metallic BB was placed on the right temple in order to reliably differentiate right from left. Multidetector CT imaging of the cervical spine was performed without intravenous contrast. Multiplanar CT image reconstructions were also generated. Multidetector CT imaging of the chest, abdomen and pelvis was performed following the standard protocol without IV contrast. COMPARISON:  None. FINDINGS: CT HEAD FINDINGS Brain:  No evidence of acute infarction, hemorrhage, hydrocephalus, extra-axial collection or mass lesion/mass effect. There is extensive volume loss in addition to extensive chronic microvascular ischemic changes. 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 Vascular: No hyperdense vessel or unexpected calcification. Skull: Normal. Negative for fracture or focal lesion. Other: None. CT MAXILLOFACIAL FINDINGS Osseous: The evaluation of the osseous structures is significantly limited by motion artifact. Given this limitation, there is no definite acute osseous abnormality. Orbits: Negative. No traumatic or inflammatory finding. Sinuses: Clear. Soft tissues: Negative. CT CERVICAL SPINE FINDINGS Alignment: Normal. Skull base and vertebrae: No acute fracture. No primary bone lesion or focal pathologic process. Soft tissues and spinal canal: No prevertebral fluid or swelling. No visible canal hematoma. Disc levels: Multilevel degenerative changes are noted throughout the cervical spine, greatest at the C5-C6 and C6-C7 levels. Other: None. CT CHEST FINDINGS Cardiovascular: Heart size is normal. Advanced atherosclerotic changes are noted of the thoracic aorta and coronary arteries. There is no significant pericardial effusion. Mediastinum/Nodes: --No mediastinal or hilar lymphadenopathy. --No axillary lymphadenopathy. --No supraclavicular lymphadenopathy. --Normal thyroid gland. --The esophagus is unremarkable Lungs/Pleura: There are areas of honeycombing involving the lung bases and lung apices. There are coarse reticular lung markings bilaterally. There is a somewhat masslike airspace opacity involving the right upper lobe measuring approximately 1.8 x 2.2 cm (axial series 5, image 57). The trachea is unremarkable. There is no pneumothorax or significant pleural effusion. The left hemidiaphragm is elevated. Musculoskeletal:  No chest wall abnormality. No acute or significant osseous findings. CT ABDOMEN AND PELVIS  FINDINGS Hepatobiliary: The liver is normal. Cholelithiasis without acute inflammation.The common bile duct is dilated measuring up to approximately 1.4 cm. Pancreas: Normal contours without ductal dilatation. No peripancreatic fluid collection. Spleen: No splenic laceration or hematoma. Adrenals/Urinary Tract: --Adrenal glands: No adrenal hemorrhage. --Right kidney/ureter: No hydronephrosis or perinephric hematoma. --Left kidney/ureter: No hydronephrosis or perinephric hematoma. --Urinary bladder: The bladder is decompressed with a Foley catheter. Stomach/Bowel: --Stomach/Duodenum: No hiatal hernia or other gastric abnormality. Normal duodenal course and caliber. --Small bowel: No dilatation or inflammation. --Colon: There is a large amount of well-formed stool at the level of the rectum. There is a moderate amount of stool throughout the remaining portions of the colon. --Appendix: Not visualized. No right lower quadrant inflammation or free fluid. Vascular/Lymphatic: Advanced vascular calcifications are noted. The infrarenal abdominal aorta measures up to approximately 2.8 cm in diameter. --No retroperitoneal lymphadenopathy. --No mesenteric lymphadenopathy. --No pelvic or inguinal lymphadenopathy. Reproductive: The prostate gland is enlarged. Other: No ascites or free air. There is mild body wall edema. Musculoskeletal. No acute displaced fractures. IMPRESSION: 1. No acute intracranial abnormality. Advanced atrophy and chronic microvascular ischemic changes are noted. 2. No acute cervical spine fracture. 3. No acute facial bone fracture, however evaluation was limited by significant motion artifact. 4. Multiple lung findings are noted as detailed above with differential considerations fibrotic lung disease with a superimposed atypical infectious process such as viral pneumonia not excluded. 5. There is a masslike airspace opacity in the right upper lobe as detailed above. This may represent an area of  consolidation, however an underlying mass is not excluded. A three-month follow-up CT of the chest is recommended. 6. Ectatic abdominal aorta at risk for aneurysm development, currently measuring 2.8 cm. Recommend followup by ultrasound in 5 years. This recommendation follows ACR consensus guidelines: White Paper of the ACR Incidental Findings Committee II on Vascular Findings. J Am Coll Radiol 2013; 10:789-794. Aortic aneurysm NOS (ICD10-I71.9) 7. Cholelithiasis with dilated common bile duct measuring up to 1.4 cm. No CT evidence of acute cholecystitis. If there is clinical concern for acute biliary pathology, recommend further evaluation with MRCP with the patient is clinically stable. An emergent MRCP would likely be degraded by significant motion artifact. 8. Large amount of well-formed stool at the level of the rectum. This may represent fecal impaction. 9. Aortic Atherosclerosis (ICD10-I70.0). Electronically Signed   By: Katherine Mantlehristopher  Green M.D.   On: 06/18/2020 21:17   DG Pelvis Portable  Result Date: Jan 10, 2020 CLINICAL DATA:  Fall EXAM: PORTABLE PELVIS 1-2 VIEWS COMPARISON:  None. FINDINGS: SI joints are non widened. Numerous phleboliths in the pelvis. Pubic symphysis and rami are intact. The femoral necks are obscured by the trochanter right greater than left. Both femoral heads project in joint. No definitive fracture seen. IMPRESSION: 1. Limited evaluation of right greater than left femoral neck due to positioning and obscuration of the neck by the trochanter. 2. No definite acute osseous abnormality is seen. Electronically Signed   By: Jasmine PangKim  Fujinaga M.D.   On: 06/18/2020 19:37   DG CHEST PORT 1 VIEW  Result Date: 01/03/2020 CLINICAL DATA:  Tachypnea, coronary artery disease post MI, LEFT ventricular dysfunction, hypertension EXAM: PORTABLE CHEST 1 VIEW COMPARISON:  Portable exam 0808 hours compared to 06/18/2020 FINDINGS: Enlargement of cardiac silhouette with slight vascular congestion.  Atherosclerotic calcification aorta. Again identified tracheal deviation to the RIGHT unchanged. Diffuse pulmonary infiltrates bilaterally which could represent multifocal pneumonia  or edema. Bibasilar effusions and atelectasis. No pneumothorax or acute osseous findings. Bones demineralized. IMPRESSION: Enlargement of cardiac silhouette with pulmonary vascular congestion. Diffuse BILATERAL pulmonary infiltrates with bibasilar pleural effusions and minimal atelectasis, favor pulmonary edema over multifocal pneumonia. Electronically Signed   By: Ulyses Southward M.D.   On: 12/30/2019 09:23   DG Chest Port 1 View  Result Date: Dec 04, 2019 CLINICAL DATA:  Fall EXAM: PORTABLE CHEST 1 VIEW COMPARISON:  05/15/2010 FINDINGS: Chronic elevation of left diaphragm. Mild reticular opacity at the right CP angle and base, likely scarring/fibrosis. Linear scarring or atelectasis left base. Mild cardiomegaly with aortic atherosclerosis. No consolidation, pleural effusion, or pneumothorax. IMPRESSION: 1. Cardiomegaly without edema or focal pulmonary opacity 2. Chronic elevation of left diaphragm. Fibrosis/scarring suspected at both bases. Electronically Signed   By: Jasmine Pang M.D.   On: 2019-12-04 19:36   DG Shoulder Right Portable  Result Date: 04-Dec-2019 CLINICAL DATA:  Larey Seat today with right shoulder pain. EXAM: PORTABLE RIGHT SHOULDER COMPARISON:  None. FINDINGS: The shoulder is held in relative internal rotation. The humeral head is properly located. Narrowed humeral acromial distance which could indicate rotator cuff pathology, either chronic or acute. AC joint unremarkable. Regional ribs are negative. IMPRESSION: No acute fracture or dislocation. Narrowed humeral acromial distance suggesting rotator cuff disease, age indeterminate. Electronically Signed   By: Paulina Fusi M.D.   On: 2019/12/04 22:18   DG Abd Portable 1V  Result Date: 12/07/2019 CLINICAL DATA:  Fecal impaction. EXAM: PORTABLE ABDOMEN - 1 VIEW  COMPARISON:  CT scan 12/06/2019 FINDINGS: 0916 hours. No gaseous small bowel or colonic dilatation. Prominent stool volume noted in the rectum. Calcified gallstones noted right upper quadrant, better characterized on recent ultrasound. Bones are diffusely demineralized. Numerous phleboliths are seen over the atomic pelvis. IMPRESSION: Prominent rectal stool volume. No small bowel or colonic dilatation. Electronically Signed   By: Kennith Center M.D.   On: 12/07/2019 09:50   ECHOCARDIOGRAM COMPLETE  Result Date: 12/04/2019    ECHOCARDIOGRAM REPORT   Patient Name:   JANELLE CULTON Date of Exam: 12/04/2019 Medical Rec #:  [id number]        Height:       66.0 in Accession #:    [id number]       Weight:       130.0 lb Date of Birth:  1924-09-25        BSA:          1.665 m Patient Age:    89 years         BP:           120/84 mmHg Patient Gender: M                HR:           75 bpm. Exam Location:  Inpatient Procedure: 2D Echo, Cardiac Doppler and Color Doppler Indications:    R94.31 Abnormal EKG  History:        Patient has prior history of Echocardiogram examinations, most                 recent 01/18/2011. LV dysfunction, CAD and Previous Myocardial                 Infarction, TIA, Signs/Symptoms:Altered Mental Status, Chest                 Pain and Dyspnea; Risk Factors:Hypertension and Dyslipidemia.  Rhabdomyolysis. Pneumonia.  Sonographer:    Roseanna Rainbow RDCS Referring Phys: [id number] [facility]  Sonographer Comments: Technically difficult study due to poor echo windows. Image acquisition challenging due to uncooperative patient. Patient has altered mental status. Very difficult study. Patient could not follow instructions and could not stop talking throughout exam. Patient could not keep arms down and out of the subcostal window. IMPRESSIONS  1. Left ventricular ejection fraction, by estimation, is 55 to 60%. The left ventricle has normal function. The left ventricle has no regional wall  motion abnormalities. Left ventricular diastolic parameters are consistent with Grade I diastolic dysfunction (impaired relaxation). Elevated left ventricular end-diastolic pressure.  2. Right ventricular systolic function is normal. The right ventricular size is normal. Tricuspid regurgitation signal is inadequate for assessing PA pressure.  3. The mitral valve is normal in structure and function. Mild mitral valve regurgitation. No evidence of mitral stenosis.  4. The aortic valve was not well visualized. Severe aortic valve annular calcification. There is severe thickening of the aortic valve. There is severe calcifcation of the aortic valve. Aortic valve mean gradient measures 26.6 mmHg. Aortic valve peak gradient measures 47.2 mmHg. Aortic valve area, by VTI measures 0.74 cm. Aortic valve regurgitation is not visualized. Moderate to severe aortic valve stenosis.  5. The inferior vena cava is normal in size with greater than 50% respiratory variability, suggesting right atrial pressure of 3 mmHg.  6. Visually the AV apperas severly stenotic but images are of poor quality. The AVA is likely underestimated due to inaccurate LVOT measurement. The dimensionless index is 0.29 and mean gradient 38mmHg. SVI is reduced at 34. Findings c/w moderate to severe AS. 7. Consider repeat echo to reassess AV once patient is less combative.  FINDINGS  Left Ventricle: Left ventricular ejection fraction, by estimation, is 55 to 60%. The left ventricle has normal function. The left ventricle has no regional wall motion abnormalities. The left ventricular internal cavity size was normal in size. There is  no left ventricular hypertrophy. Left ventricular diastolic parameters are consistent with Grade I diastolic dysfunction (impaired relaxation). Elevated left ventricular end-diastolic pressure. Right Ventricle: The right ventricular size is normal. No increase in right ventricular wall thickness. Right ventricular systolic function  is normal. Tricuspid regurgitation signal is inadequate for assessing PA pressure. Left Atrium: Left atrial size was normal in size. Right Atrium: Right atrial size was normal in size. Pericardium: There is no evidence of pericardial effusion. Mitral Valve: The mitral valve is normal in structure and function. There is mild thickening of the anterior and posterior mitral valve leaflet(s). Normal mobility of the mitral valve leaflets. Mild mitral annular calcification. Mild mitral valve regurgitation. No evidence of mitral valve stenosis. Tricuspid Valve: The tricuspid valve is normal in structure. Tricuspid valve regurgitation is not demonstrated. No evidence of tricuspid stenosis. Aortic Valve: The aortic valve was not well visualized. . There is severe thickening and severe calcifcation of the aortic valve. Aortic valve regurgitation is not visualized. Moderate to severe aortic stenosis is present. Severe aortic valve annular calcification. There is severe thickening of the aortic valve. There is severe calcifcation of the aortic valve. Aortic valve mean gradient measures 30.0 mmHg. Aortic valve peak gradient measures 54.2 mmHg. Aortic valve area, by VTI measures 0.74 cm. Pulmonic Valve: The pulmonic valve was normal in structure. Pulmonic valve regurgitation is not visualized. No evidence of pulmonic stenosis. Aorta: The aortic root is normal in size and structure. Venous: The inferior vena cava is normal in size  with greater than 50% respiratory variability, suggesting right atrial pressure of 3 mmHg. IAS/Shunts: The interatrial septum appears to be lipomatous. No atrial level shunt detected by color flow Doppler.  LEFT VENTRICLE PLAX 2D LVIDd:         4.30 cm     Diastology LVIDs:         3.40 cm     LV e' lateral:   5.98 cm/s LV PW:         1.20 cm     LV E/e' lateral: 9.6 LV IVS:        1.00 cm     LV e' medial:    3.70 cm/s LVOT diam:     1.80 cm     LV E/e' medial:  15.6 LV SV:         56 LV SV Index:   34  LVOT Area:     2.54 cm  LV Volumes (MOD) LV vol d, MOD A2C: 50.0 ml LV vol d, MOD A4C: 87.1 ml LV vol s, MOD A2C: 24.3 ml LV vol s, MOD A4C: 38.5 ml LV SV MOD A2C:     25.7 ml LV SV MOD A4C:     87.1 ml LV SV MOD BP:      34.1 ml RIGHT VENTRICLE             IVC RV S prime:     13.30 cm/s  IVC diam: 1.50 cm TAPSE (M-mode): 2.1 cm LEFT ATRIUM             Index       RIGHT ATRIUM          Index LA diam:        3.20 cm 1.92 cm/m  RA Area:     8.52 cm LA Vol (A2C):   25.0 ml 15.01 ml/m RA Volume:   14.40 ml 8.65 ml/m LA Vol (A4C):   34.2 ml 20.54 ml/m LA Biplane Vol: 29.9 ml 17.96 ml/m  AORTIC VALVE AV Area (Vmax):    0.67 cm AV Area (Vmean):   0.73 cm AV Area (VTI):     0.74 cm AV Vmax:           368.00 cm/s AV Vmean:          238.800 cm/s AV VTI:            0.761 m AV Peak Grad:      54.2 mmHg AV Mean Grad:      30.0 mmHg LVOT Vmax:         97.00 cm/s LVOT Vmean:        68.600 cm/s LVOT VTI:          0.222 m LVOT/AV VTI ratio: 0.29  AORTA Ao Root diam: 3.00 cm MITRAL VALVE MV Area (PHT): 3.77 cm    SHUNTS MV Decel Time: 201 msec    Systemic VTI:  0.22 m MV E velocity: 57.70 cm/s  Systemic Diam: 1.80 cm MV A velocity: 84.40 cm/s MV E/A ratio:  0.68 Armanda Magic MD Electronically signed by Armanda Magic MD Signature Date/Time: 12/04/2019/12:34:16 PM    Final    CT Maxillofacial Wo Contrast  Result Date: 12/04/2019 CLINICAL DATA:  Trauma. EXAM: CT HEAD WITHOUT CONTRAST CT MAXILLOFACIAL WITHOUT CONTRAST CT CERVICAL SPINE WITHOUT CONTRAST CT CHEST, ABDOMEN AND PELVIS WITHOUT CONTRAST TECHNIQUE: Contiguous axial images were obtained from the base of the skull through the vertex without intravenous contrast. Multidetector CT imaging of the maxillofacial  structures was performed. Multiplanar CT image reconstructions were also generated. A small metallic BB was placed on the right temple in order to reliably differentiate right from left. Multidetector CT imaging of the cervical spine was performed without  intravenous contrast. Multiplanar CT image reconstructions were also generated. Multidetector CT imaging of the chest, abdomen and pelvis was performed following the standard protocol without IV contrast. COMPARISON:  None. FINDINGS: CT HEAD FINDINGS Brain: No evidence of acute infarction, hemorrhage, hydrocephalus, extra-axial collection or mass lesion/mass effect. There is extensive volume loss in addition to extensive chronic microvascular ischemic changes. 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 Vascular: No hyperdense vessel or unexpected calcification. Skull: Normal. Negative for fracture or focal lesion. Other: None. CT MAXILLOFACIAL FINDINGS Osseous: The evaluation of the osseous structures is significantly limited by motion artifact. Given this limitation, there is no definite acute osseous abnormality. Orbits: Negative. No traumatic or inflammatory finding. Sinuses: Clear. Soft tissues: Negative. CT CERVICAL SPINE FINDINGS Alignment: Normal. Skull base and vertebrae: No acute fracture. No primary bone lesion or focal pathologic process. Soft tissues and spinal canal: No prevertebral fluid or swelling. No visible canal hematoma. Disc levels: Multilevel degenerative changes are noted throughout the cervical spine, greatest at the C5-C6 and C6-C7 levels. Other: None. CT CHEST FINDINGS Cardiovascular: Heart size is normal. Advanced atherosclerotic changes are noted of the thoracic aorta and coronary arteries. There is no significant pericardial effusion. Mediastinum/Nodes: --No mediastinal or hilar lymphadenopathy. --No axillary lymphadenopathy. --No supraclavicular lymphadenopathy. --Normal thyroid gland. --The esophagus is unremarkable Lungs/Pleura: There are areas of honeycombing involving the lung bases and lung apices. There are coarse reticular lung markings bilaterally. There is a somewhat masslike airspace opacity involving the right upper lobe measuring approximately 1.8 x 2.2 cm  (axial series 5, image 57). The trachea is unremarkable. There is no pneumothorax or significant pleural effusion. The left hemidiaphragm is elevated. Musculoskeletal: No chest wall abnormality. No acute or significant osseous findings. CT ABDOMEN AND PELVIS FINDINGS Hepatobiliary: The liver is normal. Cholelithiasis without acute inflammation.The common bile duct is dilated measuring up to approximately 1.4 cm. Pancreas: Normal contours without ductal dilatation. No peripancreatic fluid collection. Spleen: No splenic laceration or hematoma. Adrenals/Urinary Tract: --Adrenal glands: No adrenal hemorrhage. --Right kidney/ureter: No hydronephrosis or perinephric hematoma. --Left kidney/ureter: No hydronephrosis or perinephric hematoma. --Urinary bladder: The bladder is decompressed with a Foley catheter. Stomach/Bowel: --Stomach/Duodenum: No hiatal hernia or other gastric abnormality. Normal duodenal course and caliber. --Small bowel: No dilatation or inflammation. --Colon: There is a large amount of well-formed stool at the level of the rectum. There is a moderate amount of stool throughout the remaining portions of the colon. --Appendix: Not visualized. No right lower quadrant inflammation or free fluid. Vascular/Lymphatic: Advanced vascular calcifications are noted. The infrarenal abdominal aorta measures up to approximately 2.8 cm in diameter. --No retroperitoneal lymphadenopathy. --No mesenteric lymphadenopathy. --No pelvic or inguinal lymphadenopathy. Reproductive: The prostate gland is enlarged. Other: No ascites or free air. There is mild body wall edema. Musculoskeletal. No acute displaced fractures. IMPRESSION: 1. No acute intracranial abnormality. Advanced atrophy and chronic microvascular ischemic changes are noted. 2. No acute cervical spine fracture. 3. No acute facial bone fracture, however evaluation was limited by significant motion artifact. 4. Multiple lung findings are noted as detailed above with  differential considerations fibrotic lung disease with a superimposed atypical infectious process such as viral pneumonia not excluded. 5. There is a masslike airspace opacity in the right upper lobe as detailed above. This may  represent an area of consolidation, however an underlying mass is not excluded. A three-month follow-up CT of the chest is recommended. 6. Ectatic abdominal aorta at risk for aneurysm development, currently measuring 2.8 cm. Recommend followup by ultrasound in 5 years. This recommendation follows ACR consensus guidelines: White Paper of the ACR Incidental Findings Committee II on Vascular Findings. J Am Coll Radiol 2013; 10:789-794. Aortic aneurysm NOS (ICD10-I71.9) 7. Cholelithiasis with dilated common bile duct measuring up to 1.4 cm. No CT evidence of acute cholecystitis. If there is clinical concern for acute biliary pathology, recommend further evaluation with MRCP with the patient is clinically stable. An emergent MRCP would likely be degraded by significant motion artifact. 8. Large amount of well-formed stool at the level of the rectum. This may represent fecal impaction. 9. Aortic Atherosclerosis (ICD10-I70.0). Electronically Signed   By: Katherine Mantle M.D.   On: 11/13/2019 21:17   US Abdomen Limited RUQ  Result Date: 12/04/2019 CLINICAL DATA:  Elevated LFTs, altered mental status EXAM: ULTRASOUND ABDOMEN LIMITED RIGHT UPPER QUADRANT COMPARISON:  CT abdomen pelvis 11/25/2019 FINDINGS: Gallbladder: Echogenic, shadowing gallstone noted in the neck of the gallbladder measuring up to 1.4 cm in maximum diameter. No gallbladder wall thickening or pericholecystic fluid. Sonographic Eulah Pont sign is reportedly negative. Common bile duct: Diameter: 12 mm, dilated.  No visible intraductal gallstones. Liver: No focal lesion identified. Parenchymal echogenicity at the upper limits of normal. Portal vein is patent on color Doppler imaging with normal direction of blood flow towards the  liver. Other: None. IMPRESSION: Cholelithiasis without evidence of acute cholecystitis. Extrahepatic biliary ductal dilatation without visible stone. Distal duct is poorly assessed. If concern for choledocholithiasis, correlate with serologies and consider MRCP for further evaluation. Electronically Signed   By: Kreg Shropshire M.D.   On: 12/04/2019 03:54    Microbiology: Recent Results (from the past 240 hour(s))  Blood culture (routine x 2)     Status: None   Collection Time: 11/13/2019 10:42 PM   Specimen: BLOOD  Result Value Ref Range Status   Specimen Description BLOOD LEFT HAND  Final   Special Requests   Final    BOTTLES DRAWN AEROBIC ONLY Blood Culture results may not be optimal due to an inadequate volume of blood received in culture bottles   Culture   Final    NO GROWTH 5 DAYS Performed at [facility], [address], [place], [place] [address]    Report Status 12/08/2019 FINAL  Final  Blood culture (routine x 2)     Status: None   Collection Time: 12/06/2019 10:42 PM   Specimen: BLOOD  Result Value Ref Range Status   Specimen Description BLOOD LEFT ARM  Final   Special Requests   Final    BOTTLES DRAWN AEROBIC ONLY Blood Culture results may not be optimal due to an inadequate volume of blood received in culture bottles   Culture   Final    NO GROWTH 5 DAYS Performed at [facility], [address], [place], [place] [address]    Report Status 12/08/2019 FINAL  Final  SARS CORONAVIRUS 2 (TAT 6-24 HRS) Nasopharyngeal Nasopharyngeal Swab     Status: None   Collection Time: 12/04/19  1:00 AM   Specimen: Nasopharyngeal Swab  Result Value Ref Range Status   SARS Coronavirus 2 NEGATIVE NEGATIVE Final    Comment: (NOTE) SARS-CoV-2 target nucleic acids are NOT DETECTED. The SARS-CoV-2 RNA is generally detectable in upper and lower respiratory specimens during the acute phase  of infection. Negative results do not preclude SARS-CoV-2 infection, do not rule  out co-infections with other pathogens, and should not be used as the sole basis for treatment or other patient management decisions. Negative results must be combined with clinical observations, patient history, and epidemiological information. The expected result is Negative. Fact Sheet for Patients: HairSlick.no Fact Sheet for Healthcare Providers: quierodirigir.com This test is not yet approved or cleared by the [place] FDA and  has been authorized for detection and/or diagnosis of SARS-CoV-2 by FDA under an Emergency Use Authorization (EUA). This EUA will remain  in effect (meaning this test can be used) for the duration of the COVID-19 declaration under Section 56 4(b)(1) of the Act, 21 U.S.C. section 360bbb-3(b)(1), unless the authorization is terminated or revoked sooner. Performed at [facility], [address] [place], [place] [address]   MRSA PCR Screening     Status: Abnormal   Collection Time: 12/08/19  3:13 PM   Specimen: Nasal Mucosa; Nasopharyngeal  Result Value Ref Range Status   MRSA by PCR POSITIVE (A) NEGATIVE Final    Comment:        The GeneXpert MRSA Assay (FDA approved for NASAL specimens only), is one component of a comprehensive MRSA colonization surveillance program. It is not intended to diagnose MRSA infection nor to guide or monitor treatment for MRSA infections. RESULT CALLED TO, READ BACK BY AND VERIFIED WITH: Nolon Lennert RN 12/08/19 1722 JDW Performed at [facility], [address] [place], [place] [address]      Labs: Basic Metabolic Panel: Recent Labs  Lab 12/04/19 0103 12/04/19 0981 12/05/19 1914 12/05/19 0704 12/06/19 0858 12/06/19 0858 12/07/19 0436 12/07/19 0436 12/08/19 0648 Dec 20, 2019 0555  NA  --    < > 143  --  139  --  141  --  145 146*  K  --    < > 3.7   < > 4.2   < > 3.4*   < > 4.5 4.9  CL  --    < > 109  --  108  --  106  --  109 112*  CO2  --     < > 24  --  22  --  22  --  23 23  GLUCOSE  --    < > 81  --  85  --  85  --  95 111*  BUN  --    < > 45*  --  36*  --  28*  --  30* 41*  CREATININE  --    < > 1.91*  --  1.57*  --  1.59*  --  1.48* 1.79*  CALCIUM  --    < > 8.5*  --  8.2*  --  8.5*  --  8.6* 8.5*  MG 1.9  --   --   --   --   --   --   --   --   --    < > = values in this interval not displayed.   Liver Function Tests: Recent Labs  Lab 12/05/19 0704 12/06/19 0858 12/07/19 0436 12/08/19 0648 December 20, 2019 0555  AST 79* 58* 55* 61* 43*  ALT 41 39 41 43 40  ALKPHOS 48 41 52 57 61  BILITOT 1.1 1.3* 1.7* 1.3* 0.9  PROT 5.4* 5.2* 6.3* 6.4* 6.3*  ALBUMIN 2.3* 2.2* 2.5* 2.6* 2.5*   No results for input(s): LIPASE, AMYLASE in the last 168 hours. Recent  Labs  Lab 12/04/19 0106  AMMONIA 32   CBC: Recent Labs  Lab 12/05/19 0704 12/06/19 0858 12/07/19 0436 12/08/19 0235 December 24, 2019 0555  WBC 9.6 10.1 10.5 13.2* 12.9*  HGB 10.9* 10.7* 11.9* 12.2* 12.5*  HCT 33.4* 33.1* 36.9* 36.9* 39.7  MCV 97.9 99.4 97.6 97.1 100.8*  PLT 96* 99* 130* 132* 151   Cardiac Enzymes: Recent Labs  Lab 12/05/19 0704 12/06/19 0858 12/07/19 0436 12/08/19 0648 12-24-19 0555  CKTOTAL 940* 378 288 483* 276   D-Dimer No results for input(s): DDIMER in the last 72 hours. BNP: Invalid input(s): POCBNP CBG: Recent Labs  Lab 11/14/2019 1846  GLUCAP 83   Anemia work up No results for input(s): VITAMINB12, FOLATE, FERRITIN, TIBC, IRON, RETICCTPCT in the last 72 hours. Urinalysis    Component Value Date/Time   COLORURINE YELLOW 12/02/2019 1955   APPEARANCEUR HAZY (A) 11/18/2019 1955   LABSPEC 1.018 11/11/2019 1955   PHURINE 5.0 11/11/2019 1955   GLUCOSEU NEGATIVE 11/27/2019 1955   HGBUR LARGE (A) 11/23/2019 1955   BILIRUBINUR NEGATIVE 12/02/2019 1955   KETONESUR NEGATIVE 12/02/2019 1955   PROTEINUR 30 (A) 11/17/2019 1955   NITRITE NEGATIVE 11/14/2019 1955   LEUKOCYTESUR NEGATIVE 12/04/2019 1955   Sepsis Labs Invalid input(s):  PROCALCITONIN,  WBC,  LACTICIDVEN    SIGNED:  Rickey Barbara, MD  [facility] 12/24/19, 2:04 PM  If 7PM-7AM, please contact night-coverage [email or web address] Password [email or web address]

## 2020-01-08 NOTE — Progress Notes (Signed)
1mg  of IV ativan wasted with RN.

## 2020-01-08 NOTE — Progress Notes (Signed)
Daily Progress Note   Patient Name: Leonard Simpson       Date: 12/15/2019 DOB: [birth year]  Age: 86 y.o. MRN#: [id number] Attending Physician: Jerald Kief, MD Primary Care Physician: [facility] Admit Date: 01/06/2020  Reason for Consultation/Follow-up: Establishing goals of care  Subjective: Called by RN- patient condition is worsening. Evaluated patient at bedside- he is nonresponsive, increased work of breathing, diffuse myoclonus. Chest xray shows diffuse pulmonary edema. Dr. Rhona Leavens also present. We both agreed that patient is actively dying. I called patient's son Marquay. Offered that transition to full comfort measures is appropriate in his father's state which appears to be irreversible at this point. Lottie inquired about feeding tube- I let him know that a feeding tube would not reverse his Dad's actively dying state. I discussed use of comfort measures including morphine and Johnpaul is in agreement.    Review of Systems  Unable to perform ROS: Acuity of condition    Length of Stay: 6  Current Medications: Scheduled Meds:  . Chlorhexidine Gluconate Cloth  6 each Topical Daily  . collagenase   Topical Daily  . docusate sodium  100 mg Oral BID  . heparin  5,000 Units Subcutaneous Q8H  . OLANZapine zydis  2.5 mg Oral QHS  . pantoprazole  20 mg Oral Daily  . polyethylene glycol  17 g Oral Daily  . senna  1 tablet Oral Daily  . tamsulosin  0.4 mg Oral Daily    Continuous Infusions: . ceFEPime (MAXIPIME) IV Stopped (12/08/19 2306)  . vancomycin Stopped (12/07/19 2353)    PRN Meds: acetaminophen **OR** acetaminophen, morphine injection, nitroGLYCERIN  Physical Exam Vitals and nursing note reviewed.  Constitutional:      General: He is in acute distress.   Appearance: He is ill-appearing.  Cardiovascular:     Rate and Rhythm: Normal rate.  Skin:    Coloration: Skin is pale.  Neurological:     Comments: Nonresponsive, diffuse myoclonus             Vital Signs: BP 120/76 (BP Location: Left Arm)   Pulse 79   Temp (!) 97.5 F (36.4 C) (Axillary)   Resp (!) 28   Ht 5\' 6"  (1.676 m)   Wt 59 kg   SpO2 98%   BMI 20.98 kg/m  SpO2: SpO2: 98 %  O2 Device: O2 Device: Venturi Mask O2 Flow Rate: O2 Flow Rate (L/min): 8 L/min  Intake/output summary:   Intake/Output Summary (Last 24 hours) at 01/03/2020 1036 Last data filed at Jan 03, 2020 0400 Gross per 24 hour  Intake 2898.89 ml  Output --  Net 2898.89 ml   LBM: Last BM Date: 12/07/19 Baseline Weight: Weight: 59 kg Most recent weight: Weight: 59 kg       Palliative Assessment/Data:PPS: 10%      Patient Active Problem List   Diagnosis Date Noted  . Goals of care, counseling/discussion   . Advanced care planning/counseling discussion   . Palliative care encounter   . Dementia ([place]) 12/05/2019  . Wound of abdomen 12/05/2019  . Sepsis ([place]) 12/05/2019  . Rhabdomyolysis 12/04/2019  . AKI (acute kidney injury) ([place]) 12/04/2019  . Pneumonia 12/04/2019  . Acute metabolic encephalopathy 63/14/9702  . Elevated LFTs 12/04/2019  . Fall 12/04/2019  . Elevated troponin I level 12/04/2019  . BPH (benign prostatic hyperplasia) 12/04/2019  . Seasonal allergic rhinitis 01/12/2011  . Coronary artery disease   . MI (myocardial infarction) ([place])   . Hypertension   . Hypercholesterolemia   . GERD (gastroesophageal reflux disease)   . Left ventricular dysfunction     Palliative Care Assessment & Plan   Patient Profile: Sascha Baugher a 86 y.o.malewith multiple medical problems including CAD, hypertension, hyperlipidemia, BPH, GERD, who was admitted to hospital on 11/26/2019 with altered mental status, rhabdomyolysis, and pneumonia, after being found down on the floor at home for an  unknown amount of time.Palliative care was consulted to help address goals.  Assessment/Recommendations/Plan   Patient is actively dying  Transition to full comfort measures only  D/C interventions not intended for comfort  Allow for increased visitation  Goals of Care and Additional Recommendations:  Limitations on Scope of Treatment: Full Comfort Care  Code Status:  DNR  Prognosis:   Hours - Days  Discharge Planning:  Anticipated Hospital Death  Care plan was discussed with patient's son- Samuel Bouche, Dr. Wyline Copas, and nursing staff  Thank you for allowing the Palliative Medicine Team to assist in the care of this patient.   Time In: 1000 Time Out: 1035 Total Time 35 minutes Prolonged Time Billed no      Greater than 50%  of this time was spent counseling and coordinating care related to the above assessment and plan.  Mariana Kaufman, AGNP-C Palliative Medicine   Please contact Palliative Medicine Team phone at [phone] for questions and concerns.

## 2020-01-08 NOTE — Progress Notes (Signed)
Pt expired at 1257, confirmed by RNx 2 for 1 min each. Rhona Leavens, MD notified. Family in room. [facility] notified. ME notified, not a ME case.  Theophilus Kinds, RN

## 2020-01-08 DEATH — deceased

## 2020-09-01 IMAGING — CT CT MAXILLOFACIAL W/O CM
3 of 4 series · 13 of 47 positions shown, 15 images · non-contrast
Comparison: None.

CLINICAL DATA: Trauma.

EXAM:
CT HEAD WITHOUT CONTRAST
CT MAXILLOFACIAL WITHOUT CONTRAST
CT CERVICAL SPINE WITHOUT CONTRAST
CT CHEST, ABDOMEN AND PELVIS WITHOUT CONTRAST
TECHNIQUE: Contiguous axial images were obtained from the base of the skull
through the vertex without intravenous contrast.

[Series 3: facial/ orbits 2.0 mpr ax · axial · 0.37mm/px · z∈[-202,-35]mm · 7 of 98 slices shown, 9 images]
[im 7/98  brain]
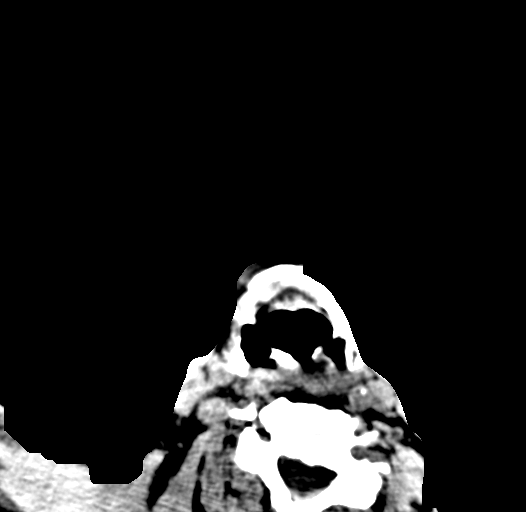
[im 7/98  bone]
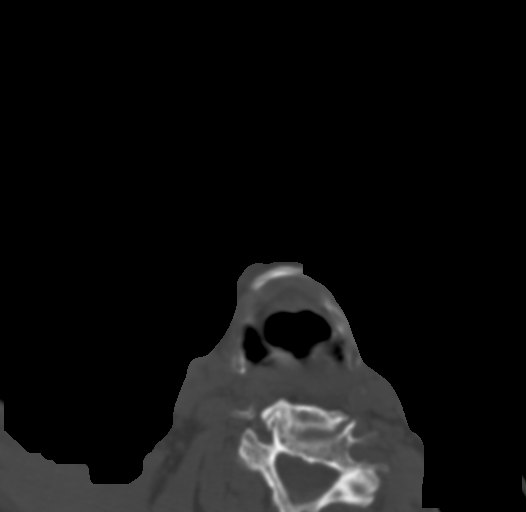
[im 21/98  bone]
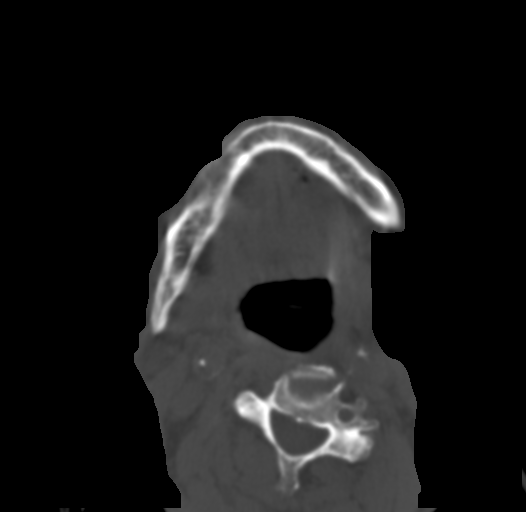
[im 35/98  bone]
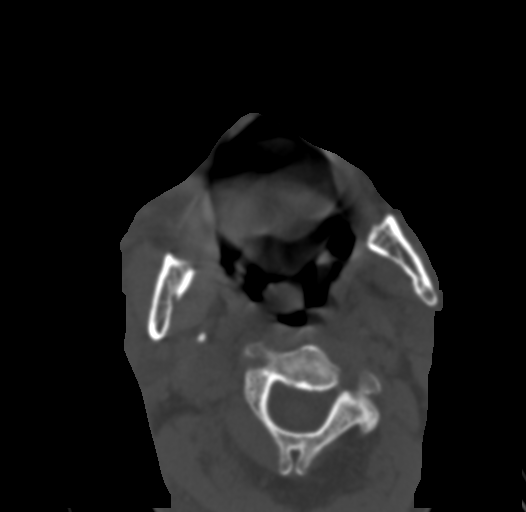
[im 49/98  bone]
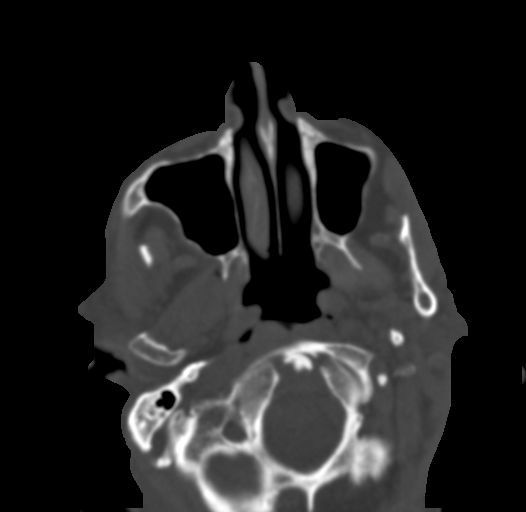
[im 63/98  brain]
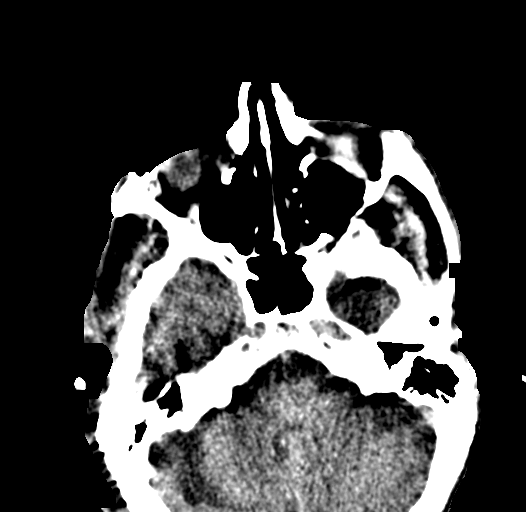
[im 63/98  bone]
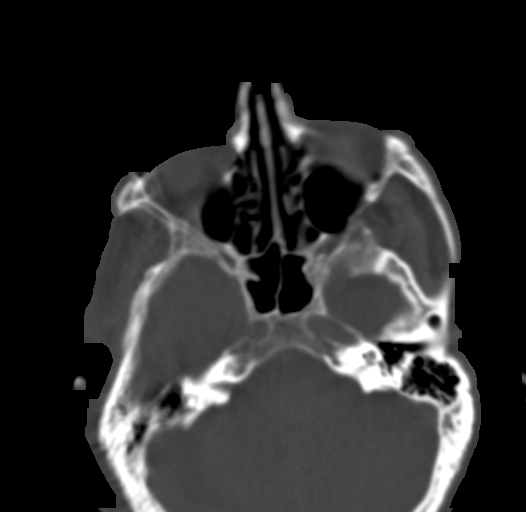
[im 77/98  bone]
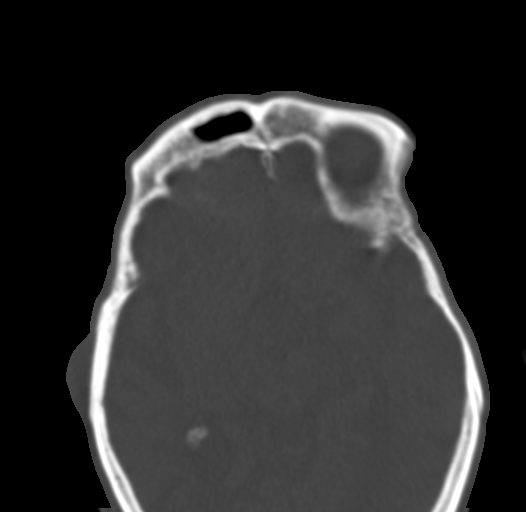
[im 91/98  bone]
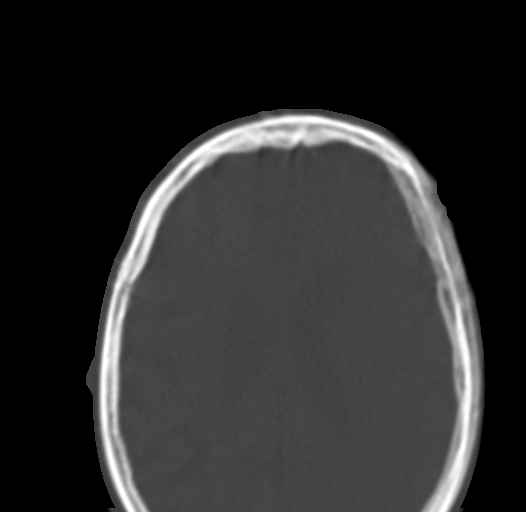

[Series 7: coronal soft tissue · coronal · 0.45mm/px · 3 of 81 slices shown]
[im 27/81  bone]
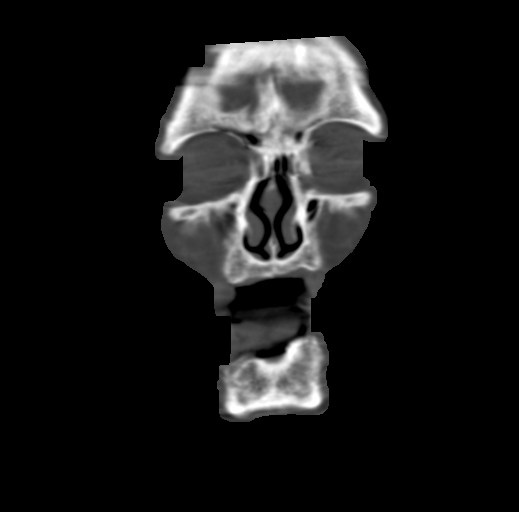
[im 36/81  bone]
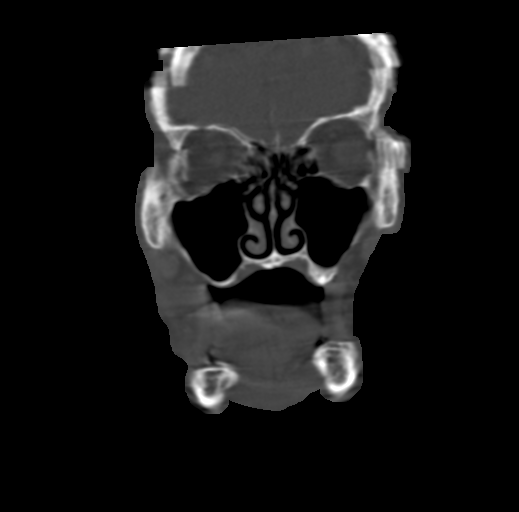
[im 45/81  bone]
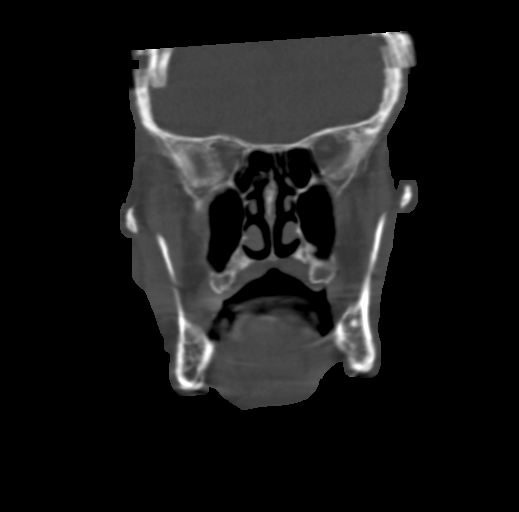

[Series 8: sagittal soft tissue · sagittal · 0.38mm/px · 3 of 96 slices shown]
[im 32/96  bone]
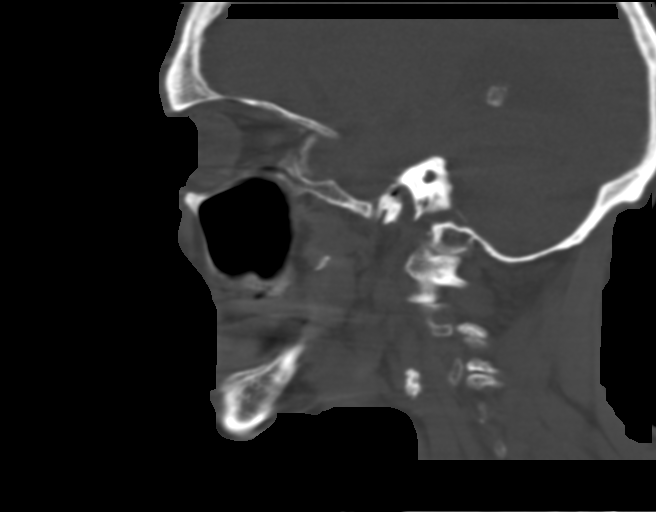
[im 48/96  bone]
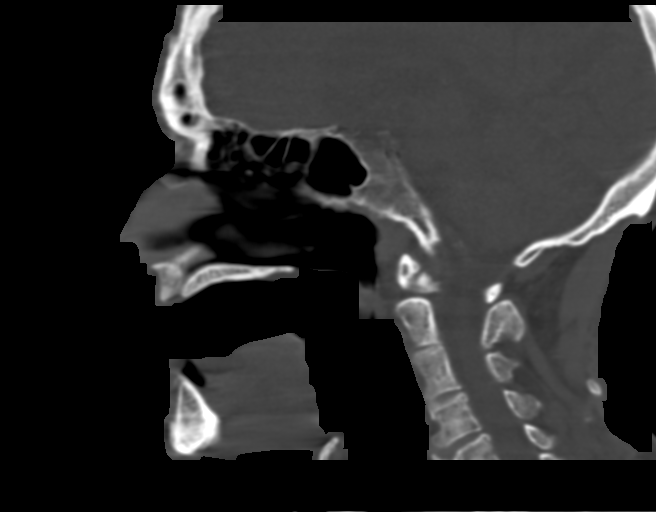
[im 64/96  bone]
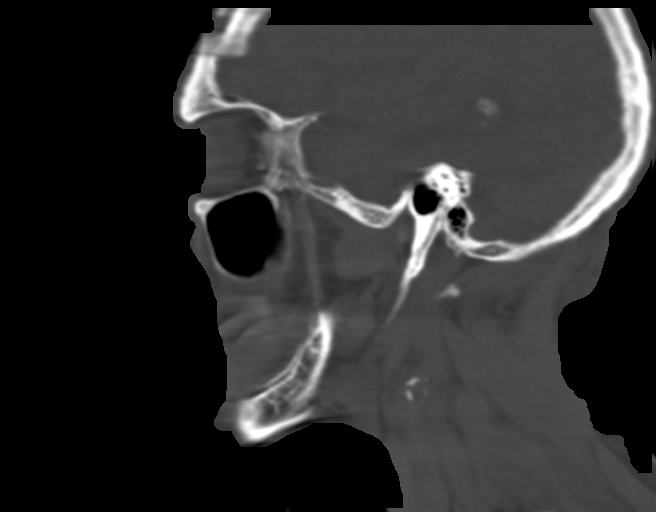

[13 of 47 positions shown; findings below may reference images not displayed]

Multidetector CT imaging of the maxillofacial structures was
performed. Multiplanar CT image reconstructions were also generated.
A small metallic BB was placed on the right temple in order to
reliably differentiate right from left.

Multidetector CT imaging of the cervical spine was performed without
intravenous contrast. Multiplanar CT image reconstructions were also
generated.

Multidetector CT imaging of the chest, abdomen and pelvis was
performed following the standard protocol without IV contrast.
FINDINGS: CT HEAD FINDINGS

Brain: No evidence of acute infarction, hemorrhage, hydrocephalus,
extra-axial collection or mass lesion/mass effect. There is
extensive volume loss in addition to extensive chronic microvascular
ischemic changes. There is a chronic appearing right occipital lobe
infarct.

Vascular: No hyperdense vessel or unexpected calcification.

Skull: Normal. Negative for fracture or focal lesion.

Other: None.

CT MAXILLOFACIAL FINDINGS

Osseous: The evaluation of the osseous structures is significantly
limited by motion artifact. Given this limitation, there is no
definite acute osseous abnormality.

Orbits: Negative. No traumatic or inflammatory finding.

Sinuses: Clear.

Soft tissues: Negative.

CT CERVICAL SPINE FINDINGS

Alignment: Normal.

Skull base and vertebrae: No acute fracture. No primary bone lesion
or focal pathologic process.

Soft tissues and spinal canal: No prevertebral fluid or swelling. No
visible canal hematoma.

Disc levels: Multilevel degenerative changes are noted throughout
the cervical spine, greatest at the C5-C6 and C6-C7 levels.

Other: None.

CT CHEST FINDINGS

Cardiovascular: Heart size is normal. Advanced atherosclerotic
changes are noted of the thoracic aorta and coronary arteries. There
is no significant pericardial effusion.

Mediastinum/Nodes:

--No mediastinal or hilar lymphadenopathy.

--No axillary lymphadenopathy.

--No supraclavicular lymphadenopathy.

--Normal thyroid gland.

--The esophagus is unremarkable

Lungs/Pleura: There are areas of honeycombing involving the lung
bases and lung apices. There are coarse reticular lung markings
bilaterally. There is a somewhat masslike airspace opacity involving
the right upper lobe measuring approximately 1.8 x 2.2 cm (axial
series 5, image 57). The trachea is unremarkable. There is no
pneumothorax or significant pleural effusion. The left hemidiaphragm
is elevated.

Musculoskeletal: No chest wall abnormality. No acute or significant
osseous findings.

CT ABDOMEN AND PELVIS FINDINGS

Hepatobiliary: The liver is normal. Cholelithiasis without acute
inflammation.The common bile duct is dilated measuring up to
approximately 1.4 cm.

Pancreas: Normal contours without ductal dilatation. No
peripancreatic fluid collection.

Spleen: No splenic laceration or hematoma.

Adrenals/Urinary Tract:

--Adrenal glands: No adrenal hemorrhage.

--Right kidney/ureter: No hydronephrosis or perinephric hematoma.

--Left kidney/ureter: No hydronephrosis or perinephric hematoma.

--Urinary bladder: The bladder is decompressed with a Foley
catheter.

Stomach/Bowel:

--Stomach/Duodenum: No hiatal hernia or other gastric abnormality.
Normal duodenal course and caliber.

--Small bowel: No dilatation or inflammation.

--Colon: There is a large amount of well-formed stool at the level
of the rectum. There is a moderate amount of stool throughout the
remaining portions of the colon.

--Appendix: Not visualized. No right lower quadrant inflammation or
free fluid.

Vascular/Lymphatic: Advanced vascular calcifications are noted. The
infrarenal abdominal aorta measures up to approximately 2.8 cm in
diameter.

--No retroperitoneal lymphadenopathy.

--No mesenteric lymphadenopathy.

--No pelvic or inguinal lymphadenopathy.

Reproductive: The prostate gland is enlarged.

Other: No ascites or free air. There is mild body wall edema.

Musculoskeletal. No acute displaced fractures.
IMPRESSION: 1. No acute intracranial abnormality. Advanced atrophy and chronic
microvascular ischemic changes are noted.
2. No acute cervical spine fracture.
3. No acute facial bone fracture, however evaluation was limited by
significant motion artifact.
4. Multiple lung findings are noted as detailed above with
differential considerations fibrotic lung disease with a
superimposed atypical infectious process such as viral pneumonia not
excluded.
5. There is a masslike airspace opacity in the right upper lobe as
detailed above. This may represent an area of consolidation, however
an underlying mass is not excluded. A three-month follow-up CT of
the chest is recommended.
6. Ectatic abdominal aorta at risk for aneurysm development,
currently measuring 2.8 cm. Recommend followup by ultrasound in 5
years. This recommendation follows ACR consensus guidelines: White
Paper of the ACR Incidental Findings Committee II on Vascular
Findings. [HOSPITAL] 0816; [DATE].
Aortic aneurysm NOS (CU8G3-UFC.3)
7. Cholelithiasis with dilated common bile duct measuring up to
cm. No CT evidence of acute cholecystitis. If there is clinical
concern for acute biliary pathology, recommend further evaluation
with MRCP with the patient is clinically stable. An emergent MRCP
would likely be degraded by significant motion artifact.
8. Large amount of well-formed stool at the level of the rectum.
This may represent fecal impaction.
9. Aortic Atherosclerosis (CU8G3-3OP.P).

## 2020-09-07 IMAGING — DX DG CHEST 1V PORT
1 series · 1 of 1 positions shown · non-contrast
Comparison: Portable exam 8383 hours compared to 12/03/2019

CLINICAL DATA: Tachypnea, coronary artery disease post MI, LEFT
ventricular dysfunction, hypertension

EXAM:
PORTABLE CHEST 1 VIEW

[chest ap]
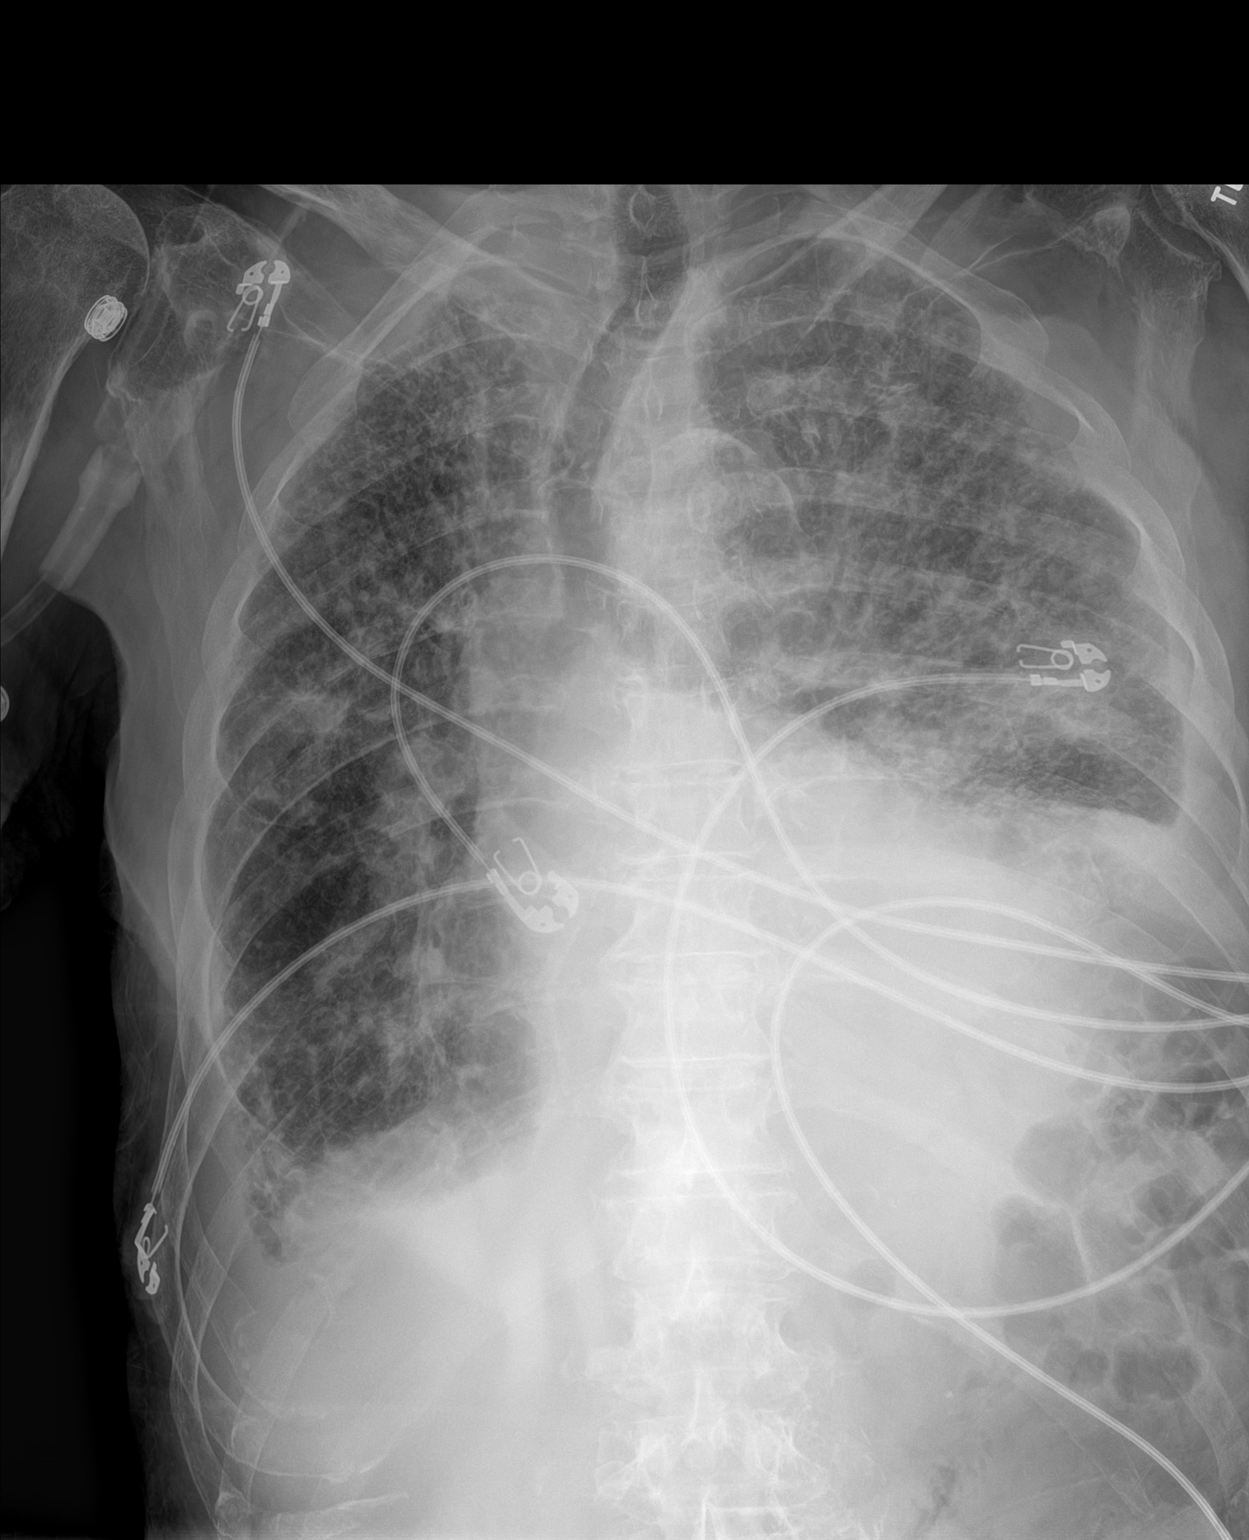

[1 of 1 positions shown; findings below may reference images not displayed]

FINDINGS: Enlargement of cardiac silhouette with slight vascular congestion.

Atherosclerotic calcification aorta.

Again identified tracheal deviation to the RIGHT unchanged.

Diffuse pulmonary infiltrates bilaterally which could represent
multifocal pneumonia or edema.

Bibasilar effusions and atelectasis.

No pneumothorax or acute osseous findings.

Bones demineralized.
IMPRESSION: Enlargement of cardiac silhouette with pulmonary vascular
congestion.

Diffuse BILATERAL pulmonary infiltrates with bibasilar pleural
effusions and minimal atelectasis, favor pulmonary edema over
multifocal pneumonia.

## 2023-10-08 ENCOUNTER — Telehealth: Payer: Self-pay
